# Patient Record
Sex: Female | Born: 1985 | Race: White | Hispanic: No | Marital: Single | Smoking: Never smoker
Health system: Southern US, Community
[De-identification: ages and names within clinical notes are randomized; demographics above are authoritative.]

## PROBLEM LIST (undated history)

## (undated) ENCOUNTER — Inpatient Hospital Stay (HOSPITAL_COMMUNITY): Payer: Self-pay

## (undated) DIAGNOSIS — B019 Varicella without complication: Secondary | ICD-10-CM

## (undated) DIAGNOSIS — K589 Irritable bowel syndrome without diarrhea: Secondary | ICD-10-CM

## (undated) DIAGNOSIS — B977 Papillomavirus as the cause of diseases classified elsewhere: Secondary | ICD-10-CM

## (undated) HISTORY — DX: Papillomavirus as the cause of diseases classified elsewhere: B97.7

## (undated) HISTORY — DX: Irritable bowel syndrome, unspecified: K58.9

## (undated) HISTORY — PX: CERVICAL CERCLAGE: SHX1329

## (undated) HISTORY — DX: Varicella without complication: B01.9

## (undated) HISTORY — PX: WISDOM TOOTH EXTRACTION: SHX21

---

## 2005-09-03 ENCOUNTER — Ambulatory Visit: Payer: Self-pay | Admitting: Family Medicine

## 2005-09-03 ENCOUNTER — Encounter: Payer: Self-pay | Admitting: Family Medicine

## 2005-09-03 ENCOUNTER — Other Ambulatory Visit: Admission: RE | Admit: 2005-09-03 | Discharge: 2005-09-03 | Payer: Self-pay | Admitting: Family Medicine

## 2006-01-24 ENCOUNTER — Ambulatory Visit: Payer: Self-pay | Admitting: Family Medicine

## 2006-03-26 ENCOUNTER — Ambulatory Visit: Payer: Self-pay | Admitting: Family Medicine

## 2006-08-01 ENCOUNTER — Ambulatory Visit: Payer: Self-pay | Admitting: Internal Medicine

## 2006-09-08 ENCOUNTER — Ambulatory Visit: Payer: Self-pay | Admitting: Family Medicine

## 2006-09-08 ENCOUNTER — Other Ambulatory Visit: Admission: RE | Admit: 2006-09-08 | Discharge: 2006-09-08 | Payer: Self-pay | Admitting: Family Medicine

## 2006-09-08 LAB — CONVERTED CEMR LAB: Pap Smear: NORMAL

## 2007-08-24 ENCOUNTER — Encounter: Payer: Self-pay | Admitting: Family Medicine

## 2007-08-24 DIAGNOSIS — K589 Irritable bowel syndrome without diarrhea: Secondary | ICD-10-CM

## 2007-09-15 ENCOUNTER — Other Ambulatory Visit: Admission: RE | Admit: 2007-09-15 | Discharge: 2007-09-15 | Payer: Self-pay | Admitting: Family Medicine

## 2007-09-15 ENCOUNTER — Encounter: Payer: Self-pay | Admitting: Family Medicine

## 2007-09-15 ENCOUNTER — Ambulatory Visit: Payer: Self-pay | Admitting: Family Medicine

## 2007-09-21 ENCOUNTER — Encounter (INDEPENDENT_AMBULATORY_CARE_PROVIDER_SITE_OTHER): Payer: Self-pay | Admitting: *Deleted

## 2008-07-12 ENCOUNTER — Encounter: Payer: Self-pay | Admitting: Family Medicine

## 2008-11-30 ENCOUNTER — Other Ambulatory Visit: Admission: RE | Admit: 2008-11-30 | Discharge: 2008-11-30 | Payer: Self-pay | Admitting: Family Medicine

## 2008-11-30 ENCOUNTER — Ambulatory Visit: Payer: Self-pay | Admitting: Family Medicine

## 2008-11-30 ENCOUNTER — Encounter: Payer: Self-pay | Admitting: Family Medicine

## 2008-12-05 ENCOUNTER — Encounter (INDEPENDENT_AMBULATORY_CARE_PROVIDER_SITE_OTHER): Payer: Self-pay | Admitting: *Deleted

## 2010-04-18 ENCOUNTER — Telehealth: Payer: Self-pay | Admitting: Family Medicine

## 2010-04-25 ENCOUNTER — Encounter: Payer: Self-pay | Admitting: Family Medicine

## 2010-04-26 ENCOUNTER — Other Ambulatory Visit: Admission: RE | Admit: 2010-04-26 | Discharge: 2010-04-26 | Payer: Self-pay | Admitting: Family Medicine

## 2010-04-26 ENCOUNTER — Ambulatory Visit: Payer: Self-pay | Admitting: Family Medicine

## 2010-04-26 LAB — HM PAP SMEAR

## 2010-05-01 ENCOUNTER — Encounter (INDEPENDENT_AMBULATORY_CARE_PROVIDER_SITE_OTHER): Payer: Self-pay | Admitting: *Deleted

## 2010-10-26 ENCOUNTER — Telehealth: Payer: Self-pay | Admitting: Family Medicine

## 2010-12-25 NOTE — Progress Notes (Signed)
Summary: rx for labs  Phone Note Call from Patient Call back at Home Phone [phone]   Caller: Patient Call For: Judith Part MD Summary of Call: Patient has CPX scheduled for 04-26-10. She is asking for a rx for blood work prior because mother works for [employer] and she woudl like to have it done there. Patient is requesting that it be mailed to home address.  Initial call taken by: Melody Comas,  Apr 18, 2010 9:24 AM  Follow-up for Phone Call        here is order for wellness labs to take to [facility] Follow-up by: Judith Part MD,  Apr 18, 2010 10:32 AM  Additional Follow-up for Phone Call Additional follow up Details #1::        Rx for lab put in mail for delivery.  Additional Follow-up by: Melody Comas,  Apr 18, 2010 11:21 AM

## 2010-12-25 NOTE — Letter (Signed)
Summary: Results Follow up Letter  [place] [facility]  [address] [place], [place] [address]   Phone: [phone]  Fax: [phone]    05/01/2010 MRN: [id number]    [facility] [address] [place], [place]  [address]    Dear Ms. HODGE,  The following are the results of your recent test(s):  Test         Result    Pap Smear:        Normal __X___  Not Normal _____ Comments: ______________________________________________________ Cholesterol: LDL(Bad cholesterol):         Your goal is less than:         HDL (Good cholesterol):       Your goal is more than: Comments:  ______________________________________________________ Mammogram:        Normal _____  Not Normal _____ Comments:  ___________________________________________________________________ Hemoccult:        Normal _____  Not normal _______ Comments:    _____________________________________________________________________ Other Tests:    We routinely do not discuss normal results over the telephone.  If you desire a copy of the results, or you have any questions about this information we can discuss them at your next office visit.   Sincerely,  Marne A. Milinda Antis, M.D.  MAT:lsf

## 2010-12-25 NOTE — Assessment & Plan Note (Signed)
Summary: cpx/alc   Vital Signs:  Patient profile:   25 year old female Height:      63 inches Weight:      144.75 pounds BMI:     25.73 Temp:     97.8 degrees F oral Pulse rate:   80 / minute Pulse rhythm:   regular BP sitting:   112 / 68  (left arm) Cuff size:   regular  Vitals Entered By: Lewanda Rife LPN (April 26, 1609 9:40 AM) CC: CPX with pap and breast exam LMP 04/18/10   History of Present Illness: here for health mt exam and pap / gyn exam   has been feeling good   wt is down 8 lb -- good bmi 25 very busy  also exercising regularly   bp great 112/68  IBS -- has been better this past year as a whole -- needs med infrequently  even with more stress has to avoid certain foods    kariva OC-- needs refil  menses - very predictable 3-4 d / mod cramps / not heavy at all  wants to stay on this oc  does not need std screen  pap 1/10 nl - no hx of abn paps   Td up to date    Allergies (verified): No Known Drug Allergies  Past History:  Past Medical History: Last updated: 11/30/2008 IBS  Family History: Last updated: 09/15/2007 GM breast cancer  Social History: Last updated: 11/30/2008 Marital Status: single Children: none Occupation: Consulting civil engineer, grad school works with student athletes non smoker  no alcohol   Risk Factors: Smoking Status: never (08/24/2007)  Review of Systems General:  Denies fatigue, loss of appetite, and malaise. Eyes:  Denies blurring, eye irritation, and eye pain. CV:  Denies chest pain or discomfort, near fainting, palpitations, and shortness of breath with exertion. Resp:  Denies cough and wheezing. GI:  Denies abdominal pain, bloody stools, change in bowel habits, and indigestion. MS:  Denies joint pain, joint redness, and joint swelling. Derm:  Denies itching, lesion(s), poor wound healing, and rash. Neuro:  Denies numbness, tingling, and weakness. Psych:  Denies anxiety and depression. Endo:  Denies cold intolerance,  excessive thirst, excessive urination, and polyuria. Heme:  Denies abnormal bruising and bleeding.  Physical Exam  General:  Well-developed,well-nourished,in no acute distress; alert,appropriate and cooperative throughout examination Head:  normocephalic, atraumatic, and no abnormalities observed.   Eyes:  vision grossly intact, pupils equal, pupils round, and pupils reactive to light.   Ears:  R ear normal and L ear normal.   Nose:  no nasal discharge.   Mouth:  pharynx pink and moist.   Neck:  supple with full rom and no masses or thyromegally, no JVD or carotid bruit  Chest Wall:  No deformities, masses, or tenderness noted. Breasts:  No mass, nodules, thickening, tenderness, bulging, retraction, inflamation, nipple discharge or skin changes noted.   Lungs:  Normal respiratory effort, chest expands symmetrically. Lungs are clear to auscultation, no crackles or wheezes. Heart:  Normal rate and regular rhythm. S1 and S2 normal without gallop, murmur, click, rub or other extra sounds. Abdomen:  Bowel sounds positive,abdomen soft and non-tender without masses, organomegaly or hernias noted. Genitalia:  Normal introitus for age, no external lesions, no vaginal discharge, mucosa pink and moist, no vaginal or cervical lesions, no vaginal atrophy, no friaility or hemorrhage, normal uterus size and position, no adnexal masses or tenderness Msk:  No deformity or scoliosis noted of thoracic or lumbar spine.  no  acute joint changes, full rom of all joints  Pulses:  R and L carotid,radial,femoral,dorsalis pedis and posterior tibial pulses are full and equal bilaterally Extremities:  No clubbing, cyanosis, edema, or deformity noted with normal full range of motion of all joints.   Neurologic:  sensation intact to light touch, gait normal, and DTRs symmetrical and normal.   Skin:  Intact without suspicious lesions or rashes Cervical Nodes:  No lymphadenopathy noted Axillary Nodes:  No palpable  lymphadenopathy Inguinal Nodes:  No significant adenopathy Psych:  normal affect, talkative and pleasant    Impression & Recommendations:  Problem # 1:  HEALTH MAINTENANCE EXAM (ICD-V70.0) Assessment Comment Only reviewed health habits including diet, exercise and skin cancer prevention reviewed health maintenance list and family history  pend labs from [facility]  Problem # 2:  GYNECOLOGICAL EXAMINATION, ROUTINE (ICD-V72.31) Assessment: Comment Only annual exam with pap  refil OC - no problems   Problem # 3:  IBS (ICD-564.1) Assessment: Improved overall improved with better diet   Complete Medication List: 1)  Kariva 0.15-0.02/0.01 Mg (21/5) Tabs (Desogestrel-ethinyl estradiol) .... One by mouth as directed once daily 2)  Align Caps (Misc intestinal flora regulat) .... One by mouth daily as needed 3)  Hyomax-sl 0.125 Mg Subl (Hyoscyamine sulfate) .... One sl as directed as needed  Patient Instructions: 1)  no change in your oral contraceptive  2)  pap done today - will update you with results  3)  I will update you with labs results when I get them Prescriptions: KARIVA 0.15-0.02/0.01 MG (21/5)  TABS (DESOGESTREL-ETHINYL ESTRADIOL) one by mouth as directed once daily  #90 x 3   Entered and Authorized by:   Judith Part MD   Signed by:   Judith Part MD on 04/26/2010   Method used:   Print then Give to Patient   RxID:   [id number]   Current Allergies (reviewed today): No known allergies

## 2010-12-25 NOTE — Progress Notes (Signed)
Summary: Erika Bryan   Phone Note Refill Request Call back at Home Phone [phone] Message from:  Patient on October 26, 2010 1:20 PM  Refills Requested: Medication #1:  KARIVA 0.15-0.02/0.01 MG (21/5)  TABS one by mouth as directed once daily [place], she just recently started a nw job. Patient doesn't knwo exactly which pharmacy she will be using yet so mom is asking for a written script to be mailed to the home address that we have in our system so that she can get it to her daughter.   Initial call taken by: Melody Comas,  October 26, 2010 2:11 PM  Follow-up for Phone Call        she will be due in june for PE-- so aim for finding new doc there before then printed in put in nurse in box for pickup  Follow-up by: Judith Part MD,  October 26, 2010 2:33 PM  Additional Follow-up for Phone Call Additional follow up Details #1::        Left message for patient to call back. Have not mailed rx yet would like to verify address first. Lewanda Rife LPN  October 26, 2010 4:22 PM     Additional Follow-up for Phone Call Additional follow up Details #2::    Spoke with pt's mother and pt is planning on taking a vacation day to see Dr Milinda Antis for her PE. Rx mailed to address in system and mother will forward to pt.Lewanda Rife LPN  October 29, 2010 8:44 AM   Prescriptions: KARIVA 0.15-0.02/0.01 MG (21/5)  TABS (DESOGESTREL-ETHINYL ESTRADIOL) one by mouth as directed once daily  #3 months x 1   Entered and Authorized by:   Judith Part MD   Signed by:   Judith Part MD on 10/26/2010   Method used:   Print then Give to Patient   RxID:   [id number]

## 2011-04-22 ENCOUNTER — Encounter: Payer: Self-pay | Admitting: Family Medicine

## 2011-05-01 ENCOUNTER — Encounter: Payer: Self-pay | Admitting: Family Medicine

## 2011-05-01 ENCOUNTER — Other Ambulatory Visit (HOSPITAL_COMMUNITY)
Admission: RE | Admit: 2011-05-01 | Discharge: 2011-05-01 | Disposition: A | Discharge status: Home / Self Care | Payer: BC Managed Care – PPO | Source: Ambulatory Visit | Attending: Family Medicine | Admitting: Family Medicine

## 2011-05-01 ENCOUNTER — Ambulatory Visit (INDEPENDENT_AMBULATORY_CARE_PROVIDER_SITE_OTHER): Payer: BC Managed Care – PPO | Admitting: Family Medicine

## 2011-05-01 DIAGNOSIS — Z01419 Encounter for gynecological examination (general) (routine) without abnormal findings: Secondary | ICD-10-CM | POA: Insufficient documentation

## 2011-05-01 DIAGNOSIS — Z124 Encounter for screening for malignant neoplasm of cervix: Secondary | ICD-10-CM | POA: Insufficient documentation

## 2011-05-01 DIAGNOSIS — Z0000 Encounter for general adult medical examination without abnormal findings: Secondary | ICD-10-CM | POA: Insufficient documentation

## 2011-05-01 DIAGNOSIS — R87810 Cervical high risk human papillomavirus (HPV) DNA test positive: Secondary | ICD-10-CM | POA: Insufficient documentation

## 2011-05-01 MED ORDER — DESOGESTREL-ETHINYL ESTRADIOL 0.15-0.02/0.01 MG (21/5) PO TABS: 1.0000 | ORAL_TABLET | Freq: Every day | ORAL | Status: DC

## 2011-05-01 NOTE — Assessment & Plan Note (Signed)
Annual exam with pap No problems or std exp Continue current OC Recommend otc nsaid for cramps prn If they worsen consider change the OC to lower estrogen or yaz

## 2011-05-01 NOTE — Assessment & Plan Note (Signed)
Reviewed health habits including diet and exercise and skin cancer prevention Also reviewed health mt list, fam hx and immunizations  Disc wt gain and exercise  Will get back to regular habits  Will check cholesterol again in next 1-2 years Rev last years labs with her

## 2011-05-01 NOTE — Patient Instructions (Signed)
Keep working on healthy exercise program  If you have worsening menstrual cramps - please let me know  Try aleve 2 pills with meal up to twice daily for cramps when you need it

## 2011-05-01 NOTE — Progress Notes (Signed)
Subjective:    Patient ID: Erika Bryan, female    DOB: 07-Oct-1986, 25 y.o.   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  Learning a lot  Done with grad school - not wanting doctorate yet   Has been feeling good  Takes fair care of herself  For exercise - less due to very long work hours  Now jogs or walks before work    Last pap nl 6/11 no problems  Std exp - none , does not want testing  On OC kariva -- likes that pill overall - cramps have increased a bit (not as bad as without a pill)  Bleeding is not too heavy - the same as usual  No abnormal paps    Td is up to date 2010  Wt is up 17 lb  Last year good labs with hdl in 60s and LDL 129-- working on low fat diet for that  No risk factors   Patient Active Problem List  Diagnoses  . IBS  . Routine general medical examination at a health care facility  . Gynecological examination   Past Medical History  Diagnosis Date  . IBS (irritable bowel syndrome)    No past surgical history on file. History  Substance Use Topics  . Smoking status: Never Smoker   . Smokeless tobacco: Not on file  . Alcohol Use:    No family history on file. No Known Allergies Current Outpatient Prescriptions on File Prior to Visit  Medication Sig Dispense Refill  . hyoscyamine (HYOMAX-SL) 0.125 MG SL tablet Place 0.125 mg under the tongue as directed. As needed.       . Probiotic Product (ALIGN PO) Take 1 tablet by mouth daily as needed.               Review of Systems Review of Systems  Constitutional: Negative for fever, appetite change, fatigue and unexpected weight change.  Eyes: Negative for pain and visual disturbance.  Respiratory: Negative for cough and shortness of breath.   Cardiovascular: Negative. For cp or sob or palp  Gastrointestinal: Negative for nausea, diarrhea and constipation.  Genitourinary: Negative for  urgency and frequency.  Skin: Negative for pallor.  gyn pos for menst cramps Neurological: Negative for weakness, light-headedness, numbness and headaches.  Hematological: Negative for adenopathy. Does not bruise/bleed easily.  Psychiatric/Behavioral: Negative for dysphoric mood. The patient is not nervous/anxious.          Objective:   Physical Exam  Constitutional: She appears well-developed and well-nourished. No distress.  HENT:  Head: Normocephalic and atraumatic.  Right Ear: External ear normal.  Left Ear: External ear normal.  Nose: Nose normal.  Mouth/Throat: Oropharynx is clear and moist.  Eyes: Conjunctivae and EOM are normal. Pupils are equal, round, and reactive to light.  Neck: Normal range of motion. No JVD present. No thyromegaly present.  Cardiovascular: Normal rate, regular rhythm, normal heart sounds and intact distal pulses.   Pulmonary/Chest: Effort normal and breath sounds normal. No respiratory distress. She has no wheezes. She exhibits no tenderness.  Abdominal: Bowel sounds are normal. She exhibits no distension and no mass. There is no tenderness.  Genitourinary: Vagina normal and uterus normal. No breast swelling, tenderness, discharge or bleeding. No vaginal discharge found.  Musculoskeletal: She exhibits no edema and no tenderness.  Lymphadenopathy:    She has no cervical  adenopathy.  Neurological: She is alert. She has normal reflexes. Coordination normal.  Skin: Skin is warm and dry. No rash noted. No erythema. No pallor.  Psychiatric: She has a normal mood and affect.          Assessment & Plan:

## 2011-05-03 ENCOUNTER — Encounter: Payer: Self-pay | Admitting: Family Medicine

## 2011-05-09 ENCOUNTER — Telehealth: Payer: Self-pay

## 2011-05-09 DIAGNOSIS — B977 Papillomavirus as the cause of diseases classified elsewhere: Secondary | ICD-10-CM

## 2011-05-09 NOTE — Telephone Encounter (Signed)
Appt Dr Patton Salles on 05/22/2011 at 1pm.  Patient notified of appt info.

## 2011-05-09 NOTE — Telephone Encounter (Signed)
Will do ref and route to [place] 

## 2011-05-09 NOTE — Telephone Encounter (Signed)
Patient notified as instructed by telephone. Pt said she would like to see GYN in [place]. Pt has never seen a GYN. Pt will wait to hear from pt care coordinator and can be reached at [phone].

## 2011-05-09 NOTE — Telephone Encounter (Signed)
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

## 2011-05-11 LAB — HM PAP SMEAR: HM Pap smear: POSITIVE

## 2011-08-08 ENCOUNTER — Other Ambulatory Visit: Payer: Self-pay | Admitting: Family Medicine

## 2012-04-02 ENCOUNTER — Telehealth: Payer: Self-pay | Admitting: Internal Medicine

## 2012-04-02 NOTE — Telephone Encounter (Signed)
Should be fine to do CPE.

## 2012-04-02 NOTE — Telephone Encounter (Signed)
Pt mom called to make Erika Bryan a new pt appointment.  She wanted to know if you could do a new patient and cpe at the same time.  i explain that on new pt you would go over family history medical history and any concerns, that you normally don't do cpx on 1st visit.  Mom explain that Erika Bryan lives in [place] and its hard for her to get off work and wanted to know if you would make exception. New patient appointment 06/11/12  @ 3

## 2012-04-03 NOTE — Telephone Encounter (Signed)
Spoke with mom to let her know dr walker was ok with doing cpe at same time as new patient

## 2012-04-22 ENCOUNTER — Other Ambulatory Visit: Payer: Self-pay | Admitting: *Deleted

## 2012-04-22 MED ORDER — DESOGESTREL-ETHINYL ESTRADIOL 0.15-0.02/0.01 MG (21/5) PO TABS: 1.0000 | ORAL_TABLET | Freq: Every day | ORAL | Status: DC

## 2012-05-11 ENCOUNTER — Other Ambulatory Visit: Payer: Self-pay | Admitting: Family Medicine

## 2012-05-11 NOTE — Telephone Encounter (Signed)
Denied--last date of Rx refill 05.29.13 #[id number]--too soon/SLS

## 2012-06-09 ENCOUNTER — Encounter: Payer: Self-pay | Admitting: Internal Medicine

## 2012-06-09 ENCOUNTER — Other Ambulatory Visit (HOSPITAL_COMMUNITY)
Admission: RE | Admit: 2012-06-09 | Discharge: 2012-06-09 | Disposition: A | Discharge status: Home / Self Care | Payer: BC Managed Care – PPO | Source: Ambulatory Visit | Attending: Internal Medicine | Admitting: Internal Medicine

## 2012-06-09 ENCOUNTER — Ambulatory Visit (INDEPENDENT_AMBULATORY_CARE_PROVIDER_SITE_OTHER): Payer: BC Managed Care – PPO | Admitting: Internal Medicine

## 2012-06-09 VITALS — BP 110/80 | HR 76 | Temp 98.8°F | Ht 62.5 in | Wt 152.0 lb

## 2012-06-09 DIAGNOSIS — R87810 Cervical high risk human papillomavirus (HPV) DNA test positive: Secondary | ICD-10-CM | POA: Insufficient documentation

## 2012-06-09 DIAGNOSIS — Z0000 Encounter for general adult medical examination without abnormal findings: Secondary | ICD-10-CM

## 2012-06-09 DIAGNOSIS — Z01419 Encounter for gynecological examination (general) (routine) without abnormal findings: Secondary | ICD-10-CM | POA: Insufficient documentation

## 2012-06-09 DIAGNOSIS — IMO0001 Reserved for inherently not codable concepts without codable children: Secondary | ICD-10-CM | POA: Insufficient documentation

## 2012-06-09 DIAGNOSIS — N760 Acute vaginitis: Secondary | ICD-10-CM | POA: Insufficient documentation

## 2012-06-09 DIAGNOSIS — Z309 Encounter for contraceptive management, unspecified: Secondary | ICD-10-CM

## 2012-06-09 LAB — COMPREHENSIVE METABOLIC PANEL
BUN: 9 mg/dL (ref 6–23)
CO2: 26 mEq/L (ref 19–32)
Creatinine, Ser: 0.8 mg/dL (ref 0.4–1.2)
GFR: 88.01 mL/min (ref >60.00)
Glucose, Bld: 89 mg/dL (ref 70–99)
Sodium: 140 mEq/L (ref 135–145)
Total Bilirubin: 1.5 mg/dL — ABNORMAL HIGH (ref 0.3–1.2)
Total Protein: 7.2 g/dL (ref 6.0–8.3)

## 2012-06-09 LAB — CBC WITH DIFFERENTIAL/PLATELET
Basophils Relative: 0.6 % (ref 0.0–3.0)
Eosinophils Relative: 0.8 % (ref 0.0–5.0)
Lymphocytes Relative: 36.4 % (ref 12.0–46.0)
MCV: 91.9 fl (ref 78.0–100.0)
Monocytes Absolute: 0.4 10*3/uL (ref 0.1–1.0)
Monocytes Relative: 6.7 % (ref 3.0–12.0)
Neutrophils Relative %: 55.5 % (ref 43.0–77.0)
RBC: 4.14 Mil/uL (ref 3.87–5.11)
WBC: 5.4 10*3/uL (ref 4.5–10.5)

## 2012-06-09 LAB — LIPID PANEL
Cholesterol: 211 mg/dL — ABNORMAL HIGH (ref 0–200)
Triglycerides: 105 mg/dL (ref 0.0–149.0)

## 2012-06-09 LAB — LDL CHOLESTEROL, DIRECT: Direct LDL: 128.9 mg/dL

## 2012-06-09 MED ORDER — DESOGESTREL-ETHINYL ESTRADIOL 0.15-0.02/0.01 MG (21/5) PO TABS: 1.0000 | ORAL_TABLET | Freq: Every day | ORAL | Status: DC

## 2012-06-09 NOTE — Assessment & Plan Note (Addendum)
General exam including breast and pelvic exam are normal today. Pap is pending. Will check basic lab work including CBC, CMP, and lipid profile. Encourage continued efforts at healthy diet and regular physical activity. Patient will followup in one year or sooner as needed.

## 2012-06-09 NOTE — Progress Notes (Signed)
Subjective:    Patient ID: Erika Bryan, female    DOB: 1986/09/06, 26 y.o.   MRN: [id number]  HPI 27 year old female presents to establish care and for general physical exam. She reports she is feeling well. She is active and works as an Runner, broadcasting/film/video at a local high school. She is physically active. She follows a relatively healthy diet. She is currently engaged to be married. 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 She underwent colposcopy he reports this was normal. She returns for yearly Pap smear today. She denies any concerns except for occasional cramping prior to her period for which she sometimes takes ibuprofen. She limits her use of ibuprofen however because of irritable bowel syndrome which seems to be made worse by this medication. She occasionally uses probiotic supplements to help with irritable bowel symptoms.  Outpatient Encounter Prescriptions as of 06/09/2012  Medication Sig Dispense Refill  . Ascorbic Acid (VITAMIN C) 1000 MG tablet Take 1,000 mg by mouth daily.      . cholecalciferol (VITAMIN D) 1000 UNITS tablet Take 1,000 Units by mouth daily.      Marland Kitchen desogestrel-ethinyl estradiol (VIORELE) 0.15-0.02/0.01 MG (21/5) tablet Take 1 tablet by mouth daily.  28 tablet  11  . hyoscyamine (HYOMAX-SL) 0.125 MG SL tablet Place 0.125 mg under the tongue as directed. As needed.       . Probiotic Product (ALIGN PO) Take 1 tablet by mouth daily as needed.        . vitamin A 8000 UNIT capsule Take 8,000 Units by mouth daily.        Review of Systems  Constitutional: Negative for fever, chills, appetite change, fatigue and unexpected weight change.  HENT: Negative for ear pain, congestion, sore throat, trouble swallowing, neck pain, voice change and sinus pressure.   Eyes: Negative for visual disturbance.  Respiratory: Negative for cough, shortness of breath, wheezing and stridor.   Cardiovascular: Negative for  chest pain, palpitations and leg swelling.  Gastrointestinal: Negative for nausea, vomiting, abdominal pain, diarrhea, constipation, blood in stool, abdominal distention and anal bleeding.  Genitourinary: Negative for dysuria and flank pain.  Musculoskeletal: Negative for myalgias, arthralgias and gait problem.  Skin: Negative for color change and rash.  Neurological: Negative for dizziness and headaches.  Hematological: Negative for adenopathy. Does not bruise/bleed easily.  Psychiatric/Behavioral: Negative for suicidal ideas, disturbed wake/sleep cycle and dysphoric mood. The patient is not nervous/anxious.    BP 110/80  Pulse 76  Temp 98.8 F (37.1 C) (Oral)  Ht 5' 2.5" (1.588 m)  Wt 152 lb (68.947 kg)  BMI 27.36 kg/m2  SpO2 98%  LMP 05/12/2012     Objective:   Physical Exam  Constitutional: She is oriented to person, place, and time. She appears well-developed and well-nourished. No distress.  HENT:  Head: Normocephalic and atraumatic.  Right Ear: External ear normal.  Left Ear: External ear normal.  Nose: Nose normal.  Mouth/Throat: Oropharynx is clear and moist. No oropharyngeal exudate.  Eyes: Conjunctivae are normal. Pupils are equal, round, and reactive to light. Right eye exhibits no discharge. Left eye exhibits no discharge. No scleral icterus.  Neck: Normal range of motion. Neck supple. No tracheal deviation present. No thyromegaly present.  Cardiovascular: Normal rate, regular rhythm, normal heart sounds and intact distal pulses.  Exam reveals no gallop and no friction rub.   No murmur heard. Pulmonary/Chest: Effort normal and breath sounds normal. No respiratory distress.  She has no wheezes. She has no rales. She exhibits no tenderness.  Abdominal: Soft. Bowel sounds are normal. She exhibits no distension and no mass. There is no tenderness. There is no rebound and no guarding.  Genitourinary: Rectum normal and uterus normal. No breast swelling, tenderness, discharge  or bleeding. Pelvic exam was performed with patient supine. There is no rash, tenderness or lesion on the right labia. There is no rash, tenderness or lesion on the left labia. Uterus is not enlarged and not tender. Cervix exhibits discharge. Cervix exhibits no motion tenderness and no friability. Right adnexum displays no mass, no tenderness and no fullness. Left adnexum displays no mass, no tenderness and no fullness. No erythema or tenderness around the vagina. Vaginal discharge found.  Musculoskeletal: Normal range of motion. She exhibits no edema and no tenderness.  Lymphadenopathy:    She has no cervical adenopathy.  Neurological: She is alert and oriented to person, place, and time. No cranial nerve deficit. She exhibits normal muscle tone. Coordination normal.  Skin: Skin is warm and dry. No rash noted. She is not diaphoretic. No erythema. No pallor.  Psychiatric: She has a normal mood and affect. Her behavior is normal. Judgment and thought content normal.          Assessment & Plan:

## 2012-06-11 ENCOUNTER — Ambulatory Visit: Payer: BC Managed Care – PPO | Admitting: Internal Medicine

## 2012-06-30 ENCOUNTER — Other Ambulatory Visit (INDEPENDENT_AMBULATORY_CARE_PROVIDER_SITE_OTHER): Payer: BC Managed Care – PPO | Admitting: *Deleted

## 2012-06-30 DIAGNOSIS — Z79899 Other long term (current) drug therapy: Secondary | ICD-10-CM

## 2012-06-30 LAB — HEPATIC FUNCTION PANEL
ALT: 16 U/L (ref 0–35)
AST: 28 U/L (ref 0–37)
Alkaline Phosphatase: 32 U/L — ABNORMAL LOW (ref 39–117)
Bilirubin, Direct: 0.1 mg/dL (ref 0.0–0.3)
Total Bilirubin: 1.2 mg/dL (ref 0.3–1.2)

## 2012-07-31 ENCOUNTER — Other Ambulatory Visit: Payer: Self-pay | Admitting: Internal Medicine

## 2012-09-02 ENCOUNTER — Encounter: Payer: BC Managed Care – PPO | Admitting: Family Medicine

## 2012-09-16 ENCOUNTER — Encounter: Payer: Self-pay | Admitting: Internal Medicine

## 2012-09-18 MED ORDER — DESOGESTREL-ETHINYL ESTRADIOL 0.15-0.02/0.01 MG (21/5) PO TABS: 1.0000 | ORAL_TABLET | Freq: Every day | ORAL | Status: DC

## 2012-09-25 ENCOUNTER — Other Ambulatory Visit: Payer: Self-pay | Admitting: Internal Medicine

## 2012-09-25 NOTE — Telephone Encounter (Signed)
Rx denied because it was sent electronically on 09/18/2012.

## 2013-01-09 ENCOUNTER — Other Ambulatory Visit: Payer: Self-pay

## 2013-06-15 ENCOUNTER — Other Ambulatory Visit (HOSPITAL_COMMUNITY)
Admission: RE | Admit: 2013-06-15 | Discharge: 2013-06-15 | Disposition: A | Discharge status: Home / Self Care | Payer: BC Managed Care – PPO | Source: Ambulatory Visit | Attending: Internal Medicine | Admitting: Internal Medicine

## 2013-06-15 ENCOUNTER — Ambulatory Visit (INDEPENDENT_AMBULATORY_CARE_PROVIDER_SITE_OTHER): Payer: BC Managed Care – PPO | Admitting: Internal Medicine

## 2013-06-15 ENCOUNTER — Encounter: Payer: Self-pay | Admitting: Internal Medicine

## 2013-06-15 VITALS — BP 130/86 | HR 77 | Temp 99.1°F | Ht 63.25 in | Wt 160.0 lb

## 2013-06-15 DIAGNOSIS — B977 Papillomavirus as the cause of diseases classified elsewhere: Secondary | ICD-10-CM

## 2013-06-15 DIAGNOSIS — IMO0002 Reserved for concepts with insufficient information to code with codable children: Secondary | ICD-10-CM

## 2013-06-15 DIAGNOSIS — Z0000 Encounter for general adult medical examination without abnormal findings: Secondary | ICD-10-CM

## 2013-06-15 DIAGNOSIS — R6889 Other general symptoms and signs: Secondary | ICD-10-CM

## 2013-06-15 DIAGNOSIS — R87810 Cervical high risk human papillomavirus (HPV) DNA test positive: Secondary | ICD-10-CM | POA: Insufficient documentation

## 2013-06-15 DIAGNOSIS — Z1151 Encounter for screening for human papillomavirus (HPV): Secondary | ICD-10-CM | POA: Insufficient documentation

## 2013-06-15 DIAGNOSIS — R7989 Other specified abnormal findings of blood chemistry: Secondary | ICD-10-CM

## 2013-06-15 DIAGNOSIS — Z01419 Encounter for gynecological examination (general) (routine) without abnormal findings: Secondary | ICD-10-CM | POA: Insufficient documentation

## 2013-06-15 LAB — COMPREHENSIVE METABOLIC PANEL
Alkaline Phosphatase: 31 U/L — ABNORMAL LOW (ref 39–117)
Creatinine, Ser: 0.9 mg/dL (ref 0.4–1.2)
Glucose, Bld: 87 mg/dL (ref 70–99)
Sodium: 138 mEq/L (ref 135–145)
Total Bilirubin: 1.3 mg/dL — ABNORMAL HIGH (ref 0.3–1.2)
Total Protein: 7.6 g/dL (ref 6.0–8.3)

## 2013-06-15 LAB — LIPID PANEL
Cholesterol: 208 mg/dL — ABNORMAL HIGH (ref 0–200)
HDL: 56.1 mg/dL (ref >39.00)
Total CHOL/HDL Ratio: 4
VLDL: 21.8 mg/dL (ref 0.0–40.0)

## 2013-06-15 LAB — LDL CHOLESTEROL, DIRECT: Direct LDL: 136.8 mg/dL

## 2013-06-15 LAB — CBC WITH DIFFERENTIAL/PLATELET
Eosinophils Relative: 0.8 % (ref 0.0–5.0)
Lymphocytes Relative: 34.5 % (ref 12.0–46.0)
Lymphs Abs: 2.5 10*3/uL (ref 0.7–4.0)
MCHC: 34.1 g/dL (ref 30.0–36.0)
MCV: 92 fl (ref 78.0–100.0)
Monocytes Relative: 4.2 % (ref 3.0–12.0)
Neutro Abs: 4.3 10*3/uL (ref 1.4–7.7)
Neutrophils Relative %: 59.8 % (ref 43.0–77.0)
Platelets: 213 10*3/uL (ref 150.0–400.0)
RBC: 4.31 Mil/uL (ref 3.87–5.11)
RDW: 12.1 % (ref 11.5–14.6)

## 2013-06-15 MED ORDER — DESOGESTREL-ETHINYL ESTRADIOL 0.15-0.02/0.01 MG (21/5) PO TABS: 1.0000 | ORAL_TABLET | Freq: Every day | ORAL | Status: DC

## 2013-06-15 NOTE — Assessment & Plan Note (Signed)
General medical exam normal today including breast exam. Pelvic exam is remarkable for friability of the cervix. Pap is pending. Encourage continued efforts at healthy diet and regular physical activity. Immunizations are up to date. Followup in one year or sooner as needed.

## 2013-06-15 NOTE — Progress Notes (Signed)
Subjective:    Patient ID: Erika Bryan, female    DOB: [birth year], 27 y.o.   MRN: [id number]  HPI 27 year old female presents for annual exam. She reports she is generally feeling well. No concerns today. She continues on oral contraceptive pill. She notes intermittent spotting on occasion between cycles. Her most recent menstrual cycle was described as heavy but generally cycles are light. She denies recent abdominal pain. She feels that she has learned to manage her IBS symptoms well by controlling food intake and using a probiotic.  Outpatient Encounter Prescriptions as of 06/15/2013  Medication Sig Dispense Refill  . desogestrel-ethinyl estradiol (VIORELE) 0.15-0.02/0.01 MG (21/5) tablet Take 1 tablet by mouth daily.  28 tablet  12  . hyoscyamine (HYOMAX-SL) 0.125 MG SL tablet Place 0.125 mg under the tongue as directed. As needed.       . MULTIPLE VITAMINS PO Take by mouth.      . Probiotic Product (ALIGN PO) Take 1 tablet by mouth daily as needed.        . Ascorbic Acid (VITAMIN C) 1000 MG tablet Take 1,000 mg by mouth daily.      . cholecalciferol (VITAMIN D) 1000 UNITS tablet Take 1,000 Units by mouth daily.      . vitamin A 8000 UNIT capsule Take 8,000 Units by mouth daily.       No facility-administered encounter medications on file as of 06/15/2013.   BP 130/86  Pulse 77  Temp(Src) 99.1 F (37.3 C) (Oral)  Ht 5' 3.25" (1.607 m)  Wt 160 lb (72.576 kg)  BMI 28.1 kg/m2  SpO2 98%  LMP 06/04/2013  Review of Systems  Constitutional: Negative for fever, chills, appetite change, fatigue and unexpected weight change.  HENT: Negative for ear pain, congestion, sore throat, trouble swallowing, neck pain, voice change and sinus pressure.   Eyes: Negative for visual disturbance.  Respiratory: Negative for cough, shortness of breath, wheezing and stridor.   Cardiovascular: Negative for chest pain, palpitations and leg swelling.  Gastrointestinal: Negative for nausea, vomiting,  abdominal pain, diarrhea, constipation, blood in stool, abdominal distention and anal bleeding.  Genitourinary: Negative for dysuria and flank pain.  Musculoskeletal: Negative for myalgias, arthralgias and gait problem.  Skin: Negative for color change and rash.  Neurological: Negative for dizziness and headaches.  Hematological: Negative for adenopathy. Does not bruise/bleed easily.  Psychiatric/Behavioral: Negative for suicidal ideas, sleep disturbance and dysphoric mood. The patient is not nervous/anxious.        Objective:   Physical Exam  Constitutional: She is oriented to person, place, and time. She appears well-developed and well-nourished. No distress.  HENT:  Head: Normocephalic and atraumatic.  Right Ear: External ear normal.  Left Ear: External ear normal.  Nose: Nose normal.  Mouth/Throat: Oropharynx is clear and moist. No oropharyngeal exudate.  Eyes: Conjunctivae are normal. Pupils are equal, round, and reactive to light. Right eye exhibits no discharge. Left eye exhibits no discharge. No scleral icterus.  Neck: Normal range of motion. Neck supple. No tracheal deviation present. No thyromegaly present.  Cardiovascular: Normal rate, regular rhythm, normal heart sounds and intact distal pulses.  Exam reveals no gallop and no friction rub.   No murmur heard. Pulmonary/Chest: Effort normal and breath sounds normal. No respiratory distress. She has no wheezes. She has no rales. She exhibits no tenderness.  Abdominal: Soft. Bowel sounds are normal. She exhibits no distension and no mass. There is no tenderness. There is no rebound and no guarding.  Genitourinary: Rectum  normal, vagina normal and uterus normal. No breast swelling, tenderness, discharge or bleeding. Pelvic exam was performed with patient supine. There is no rash, tenderness or lesion on the right labia. There is no rash, tenderness or lesion on the left labia. Uterus is not enlarged and not tender. Cervix exhibits  discharge (yellow-brown) and friability. Cervix exhibits no motion tenderness. Right adnexum displays no mass, no tenderness and no fullness. Left adnexum displays no mass, no tenderness and no fullness. No erythema or tenderness around the vagina. No vaginal discharge found.  Musculoskeletal: Normal range of motion. She exhibits no edema and no tenderness.  Lymphadenopathy:    She has no cervical adenopathy.  Neurological: She is alert and oriented to person, place, and time. No cranial nerve deficit. She exhibits normal muscle tone. Coordination normal.  Skin: Skin is warm and dry. No rash noted. She is not diaphoretic. No erythema. No pallor.  Psychiatric: She has a normal mood and affect. Her behavior is normal. Judgment and thought content normal.          Assessment & Plan:

## 2013-06-21 NOTE — Addendum Note (Signed)
Addended by: Ronna Polio A on: 06/21/2013 05:01 PM   Modules accepted: Orders

## 2013-07-27 LAB — HM PAP SMEAR: HM Pap smear: POSITIVE

## 2013-08-30 ENCOUNTER — Encounter: Payer: Self-pay | Admitting: Internal Medicine

## 2013-09-30 ENCOUNTER — Other Ambulatory Visit: Payer: Self-pay

## 2014-03-30 ENCOUNTER — Telehealth: Payer: Self-pay | Admitting: Internal Medicine

## 2014-03-30 NOTE — Telephone Encounter (Signed)
Left vm advising pt  

## 2014-03-30 NOTE — Telephone Encounter (Signed)
We can do her labs when she is here for her visit.

## 2014-03-30 NOTE — Telephone Encounter (Signed)
Pt called to confirm she will have labs at time of visit.

## 2014-03-30 NOTE — Telephone Encounter (Signed)
Pt has cpe scheduled 7/23 4:30 p.m.  Lives over an hour away.  Asking if labs are needed if she could have them done [facility] in [place], [place], Phone [phone].  Please call pt with advice.

## 2014-06-16 ENCOUNTER — Encounter: Payer: BC Managed Care – PPO | Admitting: Internal Medicine

## 2014-06-17 ENCOUNTER — Encounter: Payer: Self-pay | Admitting: *Deleted

## 2014-06-26 ENCOUNTER — Other Ambulatory Visit: Payer: Self-pay | Admitting: Internal Medicine

## 2014-06-27 NOTE — Telephone Encounter (Signed)
Appt 07/27/14

## 2014-07-27 ENCOUNTER — Ambulatory Visit (INDEPENDENT_AMBULATORY_CARE_PROVIDER_SITE_OTHER): Payer: PRIVATE HEALTH INSURANCE | Admitting: Internal Medicine

## 2014-07-27 ENCOUNTER — Encounter: Payer: Self-pay | Admitting: Internal Medicine

## 2014-07-27 ENCOUNTER — Other Ambulatory Visit (HOSPITAL_COMMUNITY)
Admission: RE | Admit: 2014-07-27 | Discharge: 2014-07-27 | Disposition: A | Discharge status: Home / Self Care | Payer: PRIVATE HEALTH INSURANCE | Source: Ambulatory Visit | Attending: Internal Medicine | Admitting: Internal Medicine

## 2014-07-27 VITALS — BP 118/80 | HR 73 | Temp 98.2°F | Ht 62.75 in | Wt 173.2 lb

## 2014-07-27 DIAGNOSIS — Z1151 Encounter for screening for human papillomavirus (HPV): Secondary | ICD-10-CM | POA: Diagnosis present

## 2014-07-27 DIAGNOSIS — E669 Obesity, unspecified: Secondary | ICD-10-CM

## 2014-07-27 DIAGNOSIS — B977 Papillomavirus as the cause of diseases classified elsewhere: Secondary | ICD-10-CM

## 2014-07-27 DIAGNOSIS — Z01419 Encounter for gynecological examination (general) (routine) without abnormal findings: Secondary | ICD-10-CM | POA: Insufficient documentation

## 2014-07-27 DIAGNOSIS — Z23 Encounter for immunization: Secondary | ICD-10-CM

## 2014-07-27 DIAGNOSIS — Z0000 Encounter for general adult medical examination without abnormal findings: Secondary | ICD-10-CM

## 2014-07-27 LAB — COMPREHENSIVE METABOLIC PANEL
ALBUMIN: 4 g/dL (ref 3.5–5.2)
ALT: 19 U/L (ref 0–35)
AST: 20 U/L (ref 0–37)
Alkaline Phosphatase: 37 U/L — ABNORMAL LOW (ref 39–117)
BUN: 12 mg/dL (ref 6–23)
CALCIUM: 9 mg/dL (ref 8.4–10.5)
CHLORIDE: 105 meq/L (ref 96–112)
CO2: 25 mEq/L (ref 19–32)
CREATININE: 0.8 mg/dL (ref 0.4–1.2)
GFR: 87.86 mL/min (ref >60.00)
GLUCOSE: 85 mg/dL (ref 70–99)
POTASSIUM: 3.9 meq/L (ref 3.5–5.1)
Sodium: 137 mEq/L (ref 135–145)
Total Bilirubin: 1.6 mg/dL — ABNORMAL HIGH (ref 0.2–1.2)
Total Protein: 6.9 g/dL (ref 6.0–8.3)

## 2014-07-27 LAB — CBC WITH DIFFERENTIAL/PLATELET
BASOS PCT: 0.6 % (ref 0.0–3.0)
Basophils Absolute: 0 10*3/uL (ref 0.0–0.1)
EOS ABS: 0.1 10*3/uL (ref 0.0–0.7)
EOS PCT: 1.1 % (ref 0.0–5.0)
HCT: 38.6 % (ref 36.0–46.0)
HEMOGLOBIN: 13.4 g/dL (ref 12.0–15.0)
LYMPHS PCT: 34.9 % (ref 12.0–46.0)
Lymphs Abs: 2.3 10*3/uL (ref 0.7–4.0)
MCHC: 34.6 g/dL (ref 30.0–36.0)
MCV: 89.9 fl (ref 78.0–100.0)
Monocytes Absolute: 0.3 10*3/uL (ref 0.1–1.0)
Monocytes Relative: 5 % (ref 3.0–12.0)
NEUTROS ABS: 3.9 10*3/uL (ref 1.4–7.7)
NEUTROS PCT: 58.4 % (ref 43.0–77.0)
Platelets: 214 10*3/uL (ref 150.0–400.0)
RBC: 4.29 Mil/uL (ref 3.87–5.11)
RDW: 12.5 % (ref 11.5–15.5)
WBC: 6.7 10*3/uL (ref 4.0–10.5)

## 2014-07-27 LAB — LIPID PANEL
CHOLESTEROL: 216 mg/dL — AB (ref 0–200)
HDL: 60.5 mg/dL (ref >39.00)
LDL Cholesterol: 133 mg/dL — ABNORMAL HIGH (ref 0–99)
NonHDL: 155.5
TRIGLYCERIDES: 115 mg/dL (ref 0.0–149.0)
Total CHOL/HDL Ratio: 4
VLDL: 23 mg/dL (ref 0.0–40.0)

## 2014-07-27 LAB — TSH: TSH: 1.53 u[IU]/mL (ref 0.35–4.50)

## 2014-07-27 LAB — VITAMIN D 25 HYDROXY (VIT D DEFICIENCY, FRACTURES): VITD: 39.17 ng/mL (ref 30.00–100.00)

## 2014-07-27 MED ORDER — DESOGESTREL-ETHINYL ESTRADIOL 0.15-0.02/0.01 MG (21/5) PO TABS
1.0000 | ORAL_TABLET | Freq: Every day | ORAL | Status: DC
Start: 2014-07-27 — End: 2016-09-27

## 2014-07-27 NOTE — Assessment & Plan Note (Signed)
General medical exam normal today including breast and pelvic exam. PAP pending. Flu vaccine today. Will check immunity to MMR, Varicella, Hep B. Encouraged healthy diet and exercise.

## 2014-07-27 NOTE — Progress Notes (Signed)
Subjective:    Patient ID: Erika Bryan, female    DOB: 1986-07-03, 28 y.o.   MRN: [id number]  HPI 28YO female presents for annual exam.  Seen by Irena Cords for colposcopy in 06/2013. Results were normal. Notes that menstrual cycles have had more cramping. Taking Aleve with some improvement.  Review of Systems  Constitutional: Negative for fever, chills, appetite change, fatigue and unexpected weight change.  Eyes: Negative for visual disturbance.  Respiratory: Negative for shortness of breath.   Cardiovascular: Negative for chest pain and leg swelling.  Gastrointestinal: Negative for nausea, vomiting, abdominal pain, diarrhea and constipation.  Genitourinary: Positive for menstrual problem.  Musculoskeletal: Negative for arthralgias and myalgias.  Skin: Negative for color change and rash.  Hematological: Negative for adenopathy. Does not bruise/bleed easily.  Psychiatric/Behavioral: Negative for sleep disturbance and dysphoric mood. The patient is not nervous/anxious.        Objective:    BP 118/80  Pulse 73  Temp(Src) 98.2 F (36.8 C) (Oral)  Ht 5' 2.75" (1.594 m)  Wt 173 lb 4 oz (78.586 kg)  BMI 30.93 kg/m2  SpO2 98%  LMP 07/08/2014 Physical Exam  Constitutional: She is oriented to person, place, and time. She appears well-developed and well-nourished. No distress.  HENT:  Head: Normocephalic and atraumatic.  Right Ear: External ear normal.  Left Ear: External ear normal.  Nose: Nose normal.  Mouth/Throat: Oropharynx is clear and moist. No oropharyngeal exudate.  Eyes: Conjunctivae are normal. Pupils are equal, round, and reactive to light. Right eye exhibits no discharge. Left eye exhibits no discharge. No scleral icterus.  Neck: Normal range of motion. Neck supple. No tracheal deviation present. No thyromegaly present.  Cardiovascular: Normal rate, regular rhythm, normal heart sounds and intact distal pulses.  Exam reveals no gallop and no friction rub.   No  murmur heard. Pulmonary/Chest: Effort normal and breath sounds normal. No respiratory distress. She has no wheezes. She has no rales. She exhibits no tenderness.  Abdominal: Soft. Bowel sounds are normal. She exhibits no distension and no mass. There is no tenderness. There is no rebound and no guarding.  Genitourinary: Rectum normal, vagina normal and uterus normal. No breast swelling, tenderness, discharge or bleeding. Pelvic exam was performed with patient supine. There is no rash, tenderness or lesion on the right labia. There is no rash, tenderness or lesion on the left labia. Uterus is not enlarged and not tender. Cervix exhibits no motion tenderness, no discharge and no friability. Right adnexum displays no mass, no tenderness and no fullness. Left adnexum displays no mass, no tenderness and no fullness. No erythema or tenderness around the vagina. No vaginal discharge found.  Musculoskeletal: Normal range of motion. She exhibits no edema and no tenderness.  Lymphadenopathy:    She has no cervical adenopathy.  Neurological: She is alert and oriented to person, place, and time. No cranial nerve deficit. She exhibits normal muscle tone. Coordination normal.  Skin: Skin is warm and dry. No rash noted. She is not diaphoretic. No erythema. No pallor.  Psychiatric: She has a normal mood and affect. Her behavior is normal. Judgment and thought content normal.          Assessment & Bryan:   Problem List Items Addressed This Visit     Unprioritized   HPV (human papilloma virus) infection   Obesity (BMI 30-39.9)      Wt Readings from Last 3 Encounters:  07/27/14 173 lb 4 oz (78.586 kg)  06/15/13 160 lb (  72.576 kg)  06/09/12 152 lb (68.947 kg)   Body mass index is 30.93 kg/(m^2).  Encouraged healthy diet and exercise.    Routine general medical examination at a health care facility - Primary     General medical exam normal today including breast and pelvic exam. PAP pending. Flu vaccine  today. Will check immunity to MMR, Varicella, Hep B. Encouraged healthy diet and exercise.    Relevant Orders      CBC with Differential      Comprehensive metabolic panel      Lipid panel      Vit D  25 hydroxy (rtn osteoporosis monitoring)      TSH      Measles/Mumps/Rubella Immunity      Varicella zoster antibody, IgG      Hepatitis B surface antibody       Return in about 1 year (around 07/28/2015) for Physical.

## 2014-07-27 NOTE — Patient Instructions (Signed)

## 2014-07-27 NOTE — Progress Notes (Signed)
Pre visit review using our clinic review tool, if applicable. No additional management support is needed unless otherwise documented below in the visit note. 

## 2014-07-27 NOTE — Assessment & Plan Note (Signed)
Wt Readings from Last 3 Encounters:  07/27/14 173 lb 4 oz (78.586 kg)  06/15/13 160 lb (72.576 kg)  06/09/12 152 lb (68.947 kg)   Body mass index is 30.93 kg/(m^2).  Encouraged healthy diet and exercise.

## 2014-07-27 NOTE — Addendum Note (Signed)
Addended by: Montine Circle D on: 07/27/2014 10:16 AM   Modules accepted: Orders

## 2014-07-27 NOTE — Addendum Note (Signed)
Addended by: Marchia Meiers on: 07/27/2014 10:28 AM   Modules accepted: Orders

## 2014-07-28 LAB — MEASLES/MUMPS/RUBELLA IMMUNITY
MUMPS IGG: 85.2 [AU]/ml — AB (ref <9.00)
RUBELLA: 6.06 {index} — AB (ref <0.90)
RUBEOLA IGG: 198 [AU]/ml — AB (ref <25.00)

## 2014-07-28 LAB — VARICELLA ZOSTER ANTIBODY, IGG: VARICELLA IGG: 1068 {index} — AB (ref <135.00)

## 2014-07-28 LAB — HEPATITIS B SURFACE ANTIBODY,QUALITATIVE: Hep B S Ab: POSITIVE — AB

## 2014-07-29 LAB — CYTOLOGY - PAP

## 2014-09-05 ENCOUNTER — Encounter: Payer: Self-pay | Admitting: Internal Medicine

## 2015-08-01 ENCOUNTER — Encounter: Payer: PRIVATE HEALTH INSURANCE | Admitting: Internal Medicine

## 2015-08-08 ENCOUNTER — Other Ambulatory Visit: Payer: Self-pay | Admitting: Obstetrics and Gynecology

## 2015-08-09 LAB — CYTOLOGY - PAP

## 2016-09-27 ENCOUNTER — Inpatient Hospital Stay (HOSPITAL_COMMUNITY): Payer: Managed Care, Other (non HMO)

## 2016-09-27 ENCOUNTER — Inpatient Hospital Stay (HOSPITAL_COMMUNITY)
Admission: AD | Admit: 2016-09-27 | Discharge: 2016-10-04 | DRG: 782 | Payer: Managed Care, Other (non HMO) | Source: Ambulatory Visit | Attending: Obstetrics and Gynecology | Admitting: Obstetrics and Gynecology

## 2016-09-27 ENCOUNTER — Encounter (HOSPITAL_COMMUNITY): Payer: Self-pay | Admitting: *Deleted

## 2016-09-27 DIAGNOSIS — O30042 Twin pregnancy, dichorionic/diamniotic, second trimester: Secondary | ICD-10-CM | POA: Diagnosis present

## 2016-09-27 DIAGNOSIS — O3432 Maternal care for cervical incompetence, second trimester: Secondary | ICD-10-CM | POA: Diagnosis present

## 2016-09-27 DIAGNOSIS — O3433 Maternal care for cervical incompetence, third trimester: Secondary | ICD-10-CM

## 2016-09-27 DIAGNOSIS — O26872 Cervical shortening, second trimester: Principal | ICD-10-CM | POA: Diagnosis present

## 2016-09-27 DIAGNOSIS — Z3A20 20 weeks gestation of pregnancy: Secondary | ICD-10-CM | POA: Diagnosis not present

## 2016-09-27 DIAGNOSIS — Z3A25 25 weeks gestation of pregnancy: Secondary | ICD-10-CM

## 2016-09-27 DIAGNOSIS — N883 Incompetence of cervix uteri: Secondary | ICD-10-CM

## 2016-09-27 LAB — CBC
HCT: 34.2 % — ABNORMAL LOW (ref 36.0–46.0)
Hemoglobin: 12 g/dL (ref 12.0–15.0)
MCH: 32.4 pg (ref 26.0–34.0)
MCHC: 35.1 g/dL (ref 30.0–36.0)
MCV: 92.4 fL (ref 78.0–100.0)
PLATELETS: 278 10*3/uL (ref 150–400)
RBC: 3.7 MIL/uL — AB (ref 3.87–5.11)
RDW: 13.5 % (ref 11.5–15.5)
WBC: 17.3 10*3/uL — ABNORMAL HIGH (ref 4.0–10.5)

## 2016-09-27 LAB — TYPE AND SCREEN
ABO/RH(D): A POS
ANTIBODY SCREEN: NEGATIVE

## 2016-09-27 LAB — ABO/RH: ABO/RH(D): A POS

## 2016-09-27 MED ORDER — PROGESTERONE MICRONIZED 200 MG PO CAPS
200.0000 mg | ORAL_CAPSULE | Freq: Every day | ORAL | Status: DC
  Administered 2016-09-27 – 2016-10-03 (×7): 200 mg via VAGINAL
  Filled 2016-09-27 (×7): qty 1

## 2016-09-27 MED ORDER — ZOLPIDEM TARTRATE 5 MG PO TABS
5.0000 mg | ORAL_TABLET | Freq: Every evening | ORAL | Status: DC | PRN
  Administered 2016-10-01: 5 mg via ORAL
  Filled 2016-09-27: qty 1

## 2016-09-27 MED ORDER — DOCUSATE SODIUM 100 MG PO CAPS
100.0000 mg | ORAL_CAPSULE | Freq: Every day | ORAL | Status: DC
  Administered 2016-09-28 – 2016-09-30 (×3): 100 mg via ORAL
  Filled 2016-09-27 (×3): qty 1

## 2016-09-27 MED ORDER — CALCIUM CARBONATE ANTACID 500 MG PO CHEW: 2.0000 | CHEWABLE_TABLET | ORAL | Status: DC | PRN

## 2016-09-27 MED ORDER — PRENATAL MULTIVITAMIN CH
1.0000 | ORAL_TABLET | Freq: Every day | ORAL | Status: DC
  Administered 2016-10-01 – 2016-10-03 (×3): 1 via ORAL
  Filled 2016-09-27 (×5): qty 1

## 2016-09-27 MED ORDER — ACETAMINOPHEN 325 MG PO TABS
650.0000 mg | ORAL_TABLET | ORAL | Status: DC | PRN
Start: 2016-09-27 — End: 2016-10-04
  Administered 2016-10-01: 650 mg via ORAL
  Filled 2016-09-27: qty 2

## 2016-09-27 NOTE — Consult Note (Signed)
US was performed in Maternal Fetal Care today. I have discussed the case with Dr. Langston MaskerMorris. Her cervical anatomy shows a 17mm length with funneling. Due to twins she is not a candidate for cerclage, but is possible a candidate for vaginal progesterone (200mg  suppository of micronized progesterone qHS or Crinone gel qHS). I recommend a 48 hour observation with a recheck of the cervical length prior to discharge. If the cervix is stable she can be managed as an outpatient on modified bedrest (off feet 20 of 24 hours). Weekly cervical evaluations in MFM would be indicated as well  Greater than 50% of my 15 minute visit was spent in face-to-face counseling of the patient and family

## 2016-09-27 NOTE — H&P (Signed)
Erika Bryan is a 30 y.o. female presenting for shortened cervical length.  The patient has di/di twin pregnancy conceived on Femara.  One week ago she was found to have dynamic cervix with normal length; today cervical length was 1.2-1.846mm.  She was admitted for further evaluation and management with MFM consult.  Per Dr. Ezzard StandingNewman of MFM, cerclage is not indicated secondary to twin gestation but he does recommend vaginal progesterone be started and strict bedrest.  The pregnancy is otherwise uncomplicated. The patient lives >1 hour from the hospital.   OB History    Gravida Para Term Preterm AB Living   2       1     SAB TAB Ectopic Multiple Live Births   1             Past Medical History:  Diagnosis Date  . Chicken pox   . HPV (human papilloma virus) infection   . IBS (irritable bowel syndrome)    s/p evaluation including colonoscopy which was normal   Past Surgical History:  Procedure Laterality Date  . WISDOM TOOTH EXTRACTION     Family History: family history includes Cancer in her maternal grandmother and paternal grandmother; Hyperlipidemia in her mother. Social History:  reports that she has never smoked. She has never used smokeless tobacco. She reports that she drinks alcohol. She reports that she does not use drugs.     Maternal Diabetes: No Genetic Screening: Normal Maternal Ultrasounds/Referrals: Abnormal:  Findings:   Other: shortened cervical length Fetal Ultrasounds or other Referrals:  Referred to Materal Fetal Medicine  Maternal Substance Abuse:  No Significant Maternal Medications:  None Significant Maternal Lab Results:  None Other Comments:  None  Review of Systems  Constitutional: Negative for fever.  Gastrointestinal: Negative for abdominal pain, nausea and vomiting.  Neurological: Negative for headaches.   Maternal Medical History:  Reason for admission: Nausea.  Fetal activity: Perceived fetal activity is normal.   Last perceived fetal movement  was within the past hour.    Prenatal Complications - Diabetes: none.      Blood pressure 120/90, pulse 90, temperature 98.4 F (36.9 C), temperature source Oral, resp. rate 16, height 5\' 3"  (1.6 m), weight 165 lb (74.8 kg), last menstrual period 05/08/2016. Maternal Exam:  Abdomen: Patient reports no abdominal tenderness. Fundal height is c/w dates.       Physical Exam  Constitutional: She is oriented to person, place, and time. She appears well-developed and well-nourished.  GI: Soft. There is no rebound and no guarding.  Neurological: She is alert and oriented to person, place, and time.  Skin: Skin is warm and dry.  Psychiatric: She has a normal mood and affect. Her behavior is normal.    Prenatal labs: ABO, Rh: --/--/A POS (11/03 1320) Antibody: NEG (11/03 1320) Rubella:   RPR:    HBsAg:    HIV:    GBS:     Assessment/Plan: 30yo G2P0 at 325w2d with shortened cervical length, di/di twins -Vaginal P4 -FHT q shift -SCDs -Rpt u/s in 48 h -bedrest  Tihanna Goodson 09/27/2016, 3:40 PM

## 2016-09-28 NOTE — Progress Notes (Signed)
No acute c/o.  Active FM.  No change in pelvic pressure or discharge.  VSS. AF. +FHT x 2  Gen: A&O x 3 Abd: soft, NT, gravid Ext: no c/c/e  30yo G2P0 at 2656w3d with di/di twins and shortened CL -Continue vaginal progesterone -Repeat cervical length tomorrow to assess for d/c plan.  Pt lives >1 hour away from the hospital -Bed rest -SCDs  Mitchel HonourMegan Stan Cantave, DO

## 2016-09-28 NOTE — Progress Notes (Signed)
In to check on pt and she reports positional low back pain and uterine cramping.  She noticed it about 2-3 hours ago and after voiding it has resolved.  Will monitor and consider Indocin course prn.   Mitchel HonourMegan Tahirih Lair, DO

## 2016-09-29 ENCOUNTER — Inpatient Hospital Stay (HOSPITAL_COMMUNITY): Payer: Managed Care, Other (non HMO)

## 2016-09-29 LAB — CBC WITH DIFFERENTIAL/PLATELET
Basophils Absolute: 0 10*3/uL (ref 0.0–0.1)
Basophils Relative: 0 %
Eosinophils Absolute: 0.1 10*3/uL (ref 0.0–0.7)
Eosinophils Relative: 1 %
HEMATOCRIT: 35.5 % — AB (ref 36.0–46.0)
HEMOGLOBIN: 12.3 g/dL (ref 12.0–15.0)
LYMPHS ABS: 2.2 10*3/uL (ref 0.7–4.0)
LYMPHS PCT: 16 %
MCH: 32 pg (ref 26.0–34.0)
MCHC: 34.6 g/dL (ref 30.0–36.0)
MCV: 92.4 fL (ref 78.0–100.0)
MONOS PCT: 5 %
Monocytes Absolute: 0.7 10*3/uL (ref 0.1–1.0)
NEUTROS ABS: 10.9 10*3/uL — AB (ref 1.7–7.7)
NEUTROS PCT: 78 %
Platelets: 257 10*3/uL (ref 150–400)
RBC: 3.84 MIL/uL — ABNORMAL LOW (ref 3.87–5.11)
RDW: 13.6 % (ref 11.5–15.5)
WBC: 13.9 10*3/uL — ABNORMAL HIGH (ref 4.0–10.5)

## 2016-09-29 NOTE — Progress Notes (Signed)
No acute c/o.  Active FM.  No change in pelvic pressure or discharge.  No continued CTX after eval last pm.  VSS. AF. +FHT x 2  Prelim read on today's u/s: CL funneling to 1.1 cm  Gen: A&O x 3 Abd: soft, NT, gravid Ext: no c/c/e  30yo G2P0 at 3568w4d with di/di twins and shortened CL -Continue vaginal progesterone -Await final read/recs on MFM u/s. Possible d/c in AM if stable. -Bed rest -SCDs  Erika HonourMegan Octavius Shin, DO

## 2016-09-29 NOTE — Progress Notes (Signed)
   09/28/16 2115  Uterine Activity  Mode Toco  Contraction Frequency (min) no contractions  pt on continuous toco with no order. Asked pt if she's contracting, she stated no but wants to keep toco on because it make her feel better to know she's not contracting on the monitor

## 2016-09-30 ENCOUNTER — Inpatient Hospital Stay (HOSPITAL_COMMUNITY): Payer: Managed Care, Other (non HMO)

## 2016-09-30 MED ORDER — FAMOTIDINE 20 MG PO TABS
20.0000 mg | ORAL_TABLET | Freq: Every day | ORAL | Status: DC
  Administered 2016-09-30 – 2016-10-01 (×2): 20 mg via ORAL
  Filled 2016-09-30 (×3): qty 1

## 2016-09-30 MED ORDER — NIFEDIPINE 10 MG PO CAPS
10.0000 mg | ORAL_CAPSULE | Freq: Four times a day (QID) | ORAL | Status: DC
  Administered 2016-09-30 – 2016-10-04 (×17): 10 mg via ORAL
  Filled 2016-09-30 (×16): qty 1

## 2016-09-30 NOTE — Progress Notes (Signed)
Not feeling UCs  VSS Afeb  D/W patient possible uterine activity reflected in dynamic cervix Will begin nifedipine and continue vaginal progesterone  U/S in am to check cervix

## 2016-09-30 NOTE — Progress Notes (Signed)
20 5/7 weeks  No leaking/bleeding Patient inquiring about pessary  VSS Afeb Uterus soft, NT UCs irregular, mild  U/S yesterday-final report pending  A/P: Di/Di Twins with cervical funneling         Vaginal progesterone         BR/SCD         I D/W Dr Ezzard StandingNewman this am-he will see patient today to discuss pessary

## 2016-09-30 NOTE — Consult Note (Signed)
Spoke to the patient and her mother at Dr. Huel Coventryomblin's request. She was curious as to whether or not pessary might be helpful in her situation. I let her know that the data on pessary in short cervix is unclear, with mixed results, especially in twins. Current SMFM recommendations do not include pessary use for the prevention of PTD outside of a clinical research study. I reviewed her US from yesterday. There is clearly uterine activity present, with multiple instances of spontaneous shortening and lengthening of the cervix. I would recommend initiation of nifedipine 10mg  q6h for uterine activity suppresion.  Greater than 50% of the face-to-face time of this 10 minute incounter was spent in counseling the patient and her family

## 2016-10-01 ENCOUNTER — Inpatient Hospital Stay (HOSPITAL_COMMUNITY): Payer: Managed Care, Other (non HMO)

## 2016-10-01 NOTE — Progress Notes (Signed)
Patient ID: Erika Bryan, female   DOB: 05/01/1986, 30 y.o.   MRN: [id number]  Has US sched today for cx length eval  Pt wants pessary trial  Cont vag P4 and PO nifedipine

## 2016-10-01 NOTE — Progress Notes (Signed)
#  3 cup pessary placed around ecto cervix per protocol>>>cx closed

## 2016-10-02 NOTE — Progress Notes (Signed)
Patient had pessary placed yesterday. She is tolerating it well.  BP 120/65 (BP Location: Left Arm)   Pulse (!) 106 Comment: nurse notified Zelvia  Temp 98.5 F (36.9 C) (Oral)   Resp 18   Ht 5\' 3"  (1.6 m)   Wt 74.8 kg (165 lb)   LMP 05/08/2016 (LMP Unknown)   SpO2 97%   BMI 29.23 kg/m  Abdomen is soft and non tender  Review of ultrasound  Ballooning membranes down to external os and external os measures maybe 1 mm  IMPRESSION: TWIN IUP at 21 weeks Cervical incompetence Short cervix  PLAN: Continue pessary  Continue nifedipine and progesterone  Discussed with patient she needs to stay in house because of high probability of preterm possible previable delivery and she lives over 1 hour from the hospital

## 2016-10-03 ENCOUNTER — Other Ambulatory Visit (HOSPITAL_COMMUNITY): Payer: Managed Care, Other (non HMO)

## 2016-10-03 MED ORDER — DOCUSATE SODIUM 100 MG PO CAPS
100.0000 mg | ORAL_CAPSULE | Freq: Every day | ORAL | Status: DC
  Administered 2016-10-03: 100 mg via ORAL
  Filled 2016-10-03 (×2): qty 1

## 2016-10-03 NOTE — Progress Notes (Signed)
TOCO ordered an hour daily. MD aware strip will not be an epic at this time/

## 2016-10-03 NOTE — Progress Notes (Signed)
Patient ID: Erika Bryan, female   DOB: 06/16/1986, 30 y.o.   MRN: [id number] Pt reports GFM Cant feel ctxs  VSSAF FHR + x 2  Abd Gravid , nt Neg homans  Twins at 21 weeks PTL and cervical incompetence  Continue pessary  Continue nifedipine and progesterone  Discussed with patient she needs to stay in house because of high probability of preterm possible previable delivery and she lives over 1 hour from the hospital US tomorrow to assess cx.  Toco 1 h q day

## 2016-10-04 ENCOUNTER — Inpatient Hospital Stay (HOSPITAL_COMMUNITY): Payer: Managed Care, Other (non HMO)

## 2016-10-04 MED ORDER — BUPIVACAINE HCL (PF) 0.25 % IJ SOLN
INTRAMUSCULAR | Status: AC
  Filled 2016-10-04: qty 30

## 2016-10-04 NOTE — Progress Notes (Signed)
   10/04/16 0950  Section 1: Physician Certification  Patient Condition Patient stabilized  Reason for Transfer cervical incompetence  Benefits of Transfer inpatient care by maternal fetal medicine  Accepting Physician Dr Laray AngerNiche, MFM  S[facility] Physician [facility]  Physician Reassessment Patient examined and risks and benefits explained  Section 2: Nurse Certification  Accepting hospital or facility Available service confirmed  Accepting Facility [facility]

## 2016-10-04 NOTE — Progress Notes (Signed)
Report given to [facility], L&D triage RN at [facility]. [phone].

## 2016-10-04 NOTE — Progress Notes (Signed)
Pt resting.  Denies ctx, pain, vb and lof.  AF, VSS + FHT x 2 Abd - gravid, NT Ext - NT, no edema  US:  Progressive shortening of cervix - no measurable length with dilation (approx 2.5-3cm)  A/P:  Twin gestation @ 21+2wks Cervical incompetence Discussed case with Dr Laray [facility]Niche @ [facility] re: transfer for emergent rescue cerclage He accepts pt in transfer.  Pt and family agree with plan of care

## 2016-10-07 ENCOUNTER — Other Ambulatory Visit (HOSPITAL_COMMUNITY): Payer: Self-pay | Admitting: Maternal and Fetal Medicine

## 2016-10-07 DIAGNOSIS — O3432 Maternal care for cervical incompetence, second trimester: Secondary | ICD-10-CM

## 2016-10-11 ENCOUNTER — Encounter (HOSPITAL_COMMUNITY): Payer: Self-pay

## 2016-10-11 ENCOUNTER — Ambulatory Visit (HOSPITAL_COMMUNITY)
Admission: RE | Admit: 2016-10-11 | Discharge: 2016-10-11 | Disposition: A | Discharge status: Home / Self Care | Payer: Managed Care, Other (non HMO) | Source: Ambulatory Visit | Attending: Obstetrics and Gynecology | Admitting: Obstetrics and Gynecology

## 2016-10-11 DIAGNOSIS — O30042 Twin pregnancy, dichorionic/diamniotic, second trimester: Secondary | ICD-10-CM | POA: Insufficient documentation

## 2016-10-11 DIAGNOSIS — O26852 Spotting complicating pregnancy, second trimester: Secondary | ICD-10-CM | POA: Diagnosis not present

## 2016-10-11 DIAGNOSIS — O26872 Cervical shortening, second trimester: Secondary | ICD-10-CM | POA: Insufficient documentation

## 2016-10-11 DIAGNOSIS — O3432 Maternal care for cervical incompetence, second trimester: Secondary | ICD-10-CM

## 2016-10-11 DIAGNOSIS — Z3A22 22 weeks gestation of pregnancy: Secondary | ICD-10-CM

## 2016-10-11 NOTE — ED Notes (Signed)
Pt states she has had some light pink/yellowish discharge when she first wakes up in the morning.  Pt states she's been having some issues with constipation but has started colace.  Successful BM today.  Pt states she has also had some pressure which stopped after the BM.  Increased in stringy yellowish/clear discharge.  Pt is currently using progesterone at bedtime.

## 2016-10-14 ENCOUNTER — Other Ambulatory Visit (HOSPITAL_COMMUNITY): Payer: Self-pay | Admitting: *Deleted

## 2016-10-14 ENCOUNTER — Encounter (HOSPITAL_COMMUNITY): Payer: Self-pay | Admitting: *Deleted

## 2016-10-14 ENCOUNTER — Inpatient Hospital Stay (HOSPITAL_COMMUNITY)
Admission: AD | Admit: 2016-10-14 | Discharge: 2016-10-15 | DRG: 782 | Payer: Managed Care, Other (non HMO) | Source: Ambulatory Visit | Attending: Obstetrics and Gynecology | Admitting: Obstetrics and Gynecology

## 2016-10-14 ENCOUNTER — Inpatient Hospital Stay (HOSPITAL_COMMUNITY): Payer: Managed Care, Other (non HMO)

## 2016-10-14 DIAGNOSIS — O09219 Supervision of pregnancy with history of pre-term labor, unspecified trimester: Secondary | ICD-10-CM

## 2016-10-14 DIAGNOSIS — O26852 Spotting complicating pregnancy, second trimester: Secondary | ICD-10-CM | POA: Diagnosis present

## 2016-10-14 DIAGNOSIS — Z3A22 22 weeks gestation of pregnancy: Secondary | ICD-10-CM

## 2016-10-14 DIAGNOSIS — O30042 Twin pregnancy, dichorionic/diamniotic, second trimester: Secondary | ICD-10-CM | POA: Diagnosis present

## 2016-10-14 DIAGNOSIS — O3432 Maternal care for cervical incompetence, second trimester: Principal | ICD-10-CM | POA: Diagnosis present

## 2016-10-14 DIAGNOSIS — O30049 Twin pregnancy, dichorionic/diamniotic, unspecified trimester: Secondary | ICD-10-CM

## 2016-10-14 DIAGNOSIS — N883 Incompetence of cervix uteri: Secondary | ICD-10-CM

## 2016-10-14 LAB — URINALYSIS, ROUTINE W REFLEX MICROSCOPIC
BILIRUBIN URINE: NEGATIVE
Glucose, UA: NEGATIVE mg/dL
KETONES UR: NEGATIVE mg/dL
NITRITE: NEGATIVE
PH: 6.5 (ref 5.0–8.0)
Protein, ur: NEGATIVE mg/dL

## 2016-10-14 LAB — URINE MICROSCOPIC-ADD ON: Bacteria, UA: NONE SEEN

## 2016-10-14 LAB — CBC
HEMATOCRIT: 36.6 % (ref 36.0–46.0)
Hemoglobin: 12.9 g/dL (ref 12.0–15.0)
MCH: 32.2 pg (ref 26.0–34.0)
MCHC: 35.2 g/dL (ref 30.0–36.0)
MCV: 91.3 fL (ref 78.0–100.0)
PLATELETS: 276 10*3/uL (ref 150–400)
RBC: 4.01 MIL/uL (ref 3.87–5.11)
RDW: 13 % (ref 11.5–15.5)
WBC: 18.9 10*3/uL — ABNORMAL HIGH (ref 4.0–10.5)

## 2016-10-14 LAB — TYPE AND SCREEN
ABO/RH(D): A POS
Antibody Screen: NEGATIVE

## 2016-10-14 MED ORDER — LACTATED RINGERS IV BOLUS (SEPSIS)
1000.0000 mL | Freq: Once | INTRAVENOUS | Status: AC
  Administered 2016-10-14: 1000 mL via INTRAVENOUS

## 2016-10-14 MED ORDER — MAGNESIUM SULFATE BOLUS VIA INFUSION
4.0000 g | Freq: Once | INTRAVENOUS | Status: AC
  Administered 2016-10-14: 4 g via INTRAVENOUS
  Filled 2016-10-14: qty 500

## 2016-10-14 MED ORDER — CALCIUM CARBONATE ANTACID 500 MG PO CHEW: 2.0000 | CHEWABLE_TABLET | ORAL | Status: DC | PRN

## 2016-10-14 MED ORDER — PRENATAL MULTIVITAMIN CH
1.0000 | ORAL_TABLET | Freq: Every day | ORAL | Status: DC
  Administered 2016-10-15: 1 via ORAL
  Filled 2016-10-14: qty 1

## 2016-10-14 MED ORDER — ACETAMINOPHEN 325 MG PO TABS
650.0000 mg | ORAL_TABLET | ORAL | Status: DC | PRN
Start: 2016-10-14 — End: 2016-10-15
  Filled 2016-10-14: qty 2

## 2016-10-14 MED ORDER — DOCUSATE SODIUM 100 MG PO CAPS: 100.0000 mg | ORAL_CAPSULE | Freq: Every day | ORAL | Status: DC

## 2016-10-14 MED ORDER — MAGNESIUM SULFATE 50 % IJ SOLN
2.0000 g/h | INTRAMUSCULAR | Status: DC
  Administered 2016-10-14 – 2016-10-15 (×2): 2 g/h via INTRAVENOUS
  Filled 2016-10-14 (×2): qty 80

## 2016-10-14 MED ORDER — PRENATAL MULTIVITAMIN CH: 1.0000 | ORAL_TABLET | Freq: Every day | ORAL | Status: DC

## 2016-10-14 MED ORDER — ZOLPIDEM TARTRATE 5 MG PO TABS: 5.0000 mg | ORAL_TABLET | Freq: Every evening | ORAL | Status: DC | PRN

## 2016-10-14 MED ORDER — BETAMETHASONE SOD PHOS & ACET 6 (3-3) MG/ML IJ SUSP
12.0000 mg | INTRAMUSCULAR | Status: DC
  Administered 2016-10-14: 12 mg via INTRAMUSCULAR
  Filled 2016-10-14 (×2): qty 2

## 2016-10-14 MED ORDER — PROGESTERONE MICRONIZED 200 MG PO CAPS
200.0000 mg | ORAL_CAPSULE | Freq: Every day | ORAL | Status: DC
  Administered 2016-10-14: 200 mg via VAGINAL
  Filled 2016-10-14: qty 1

## 2016-10-14 MED ORDER — DOCUSATE SODIUM 100 MG PO CAPS
100.0000 mg | ORAL_CAPSULE | Freq: Every day | ORAL | Status: DC
  Administered 2016-10-15: 100 mg via ORAL
  Filled 2016-10-14 (×2): qty 1

## 2016-10-14 MED ORDER — LACTATED RINGERS IV SOLN
INTRAVENOUS | Status: DC
  Administered 2016-10-14 (×2): via INTRAVENOUS

## 2016-10-14 NOTE — MAU Note (Signed)
Cerclage placed 10/11.  Started spotting, noted stringy red  When wiped. First noted yesterday morning after BM.  Continued through the day. Noted again this morning after Bm, then stopped, now has restarted.  Has been having ? [name] today.  The most was 4/hr.  Usually had 2/hr today, 3 on the way here. Every thing was stable at check on Friday.

## 2016-10-14 NOTE — H&P (Signed)
Golden PopLeslie R Bathe is a 30 y.o. female presenting for spotting and irreg ctx, hospt here 11/07 with shotened cervix, not felt to be candidate for cerclage>>>was treated with BR + vag P4 and nifedipine, later cup pessary was placed>>  2 days later, US consist with poss cx dilitation and [facility]>>then D/C home to cont nifedipine + vag P4  Called office today with some irreg ctx + spotting, adm for PTL eval. OB History    Gravida Para Term Preterm AB Living   2       1     SAB TAB Ectopic Multiple Live Births   1             Past Medical History:  Diagnosis Date  . Chicken pox   . HPV (human papilloma virus) infection   . IBS (irritable bowel syndrome)    s/p evaluation including colonoscopy which was normal   Past Surgical History:  Procedure Laterality Date  . CERVICAL CERCLAGE    . WISDOM TOOTH EXTRACTION     Family History: family history includes Cancer in her maternal grandmother and paternal grandmother; Hyperlipidemia in her mother. Social History:  reports that she has never smoked. She has never used smokeless tobacco. She reports that she drinks alcohol. She reports that she does not use drugs.     Maternal Diabetes: No Genetic Screening: Normal Maternal Ultrasounds/Referrals: Normal Fetal Ultrasounds or other Referrals:  None Maternal Substance Abuse:  No Significant Maternal Medications:  None Significant Maternal Lab Results:  None Other Comments:  None  ROS Maternal Medical History:  Reason for admission: Contractions and vaginal bleeding.   Contractions: Onset was 3-5 hours ago.   Frequency: irregular.   Perceived severity is mild.        Blood pressure 130/72, pulse 89, temperature 98.2 F (36.8 C), temperature source Oral, resp. rate 18, height 5\' 3"  (1.6 m), weight 183 lb (83 kg), last menstrual period 05/08/2016. Exam Physical Exam  Constitutional: She is oriented to person, place, and time. She appears  well-developed and well-nourished.  HENT:  Head: Normocephalic and atraumatic.  Neck: Normal range of motion. Neck supple.  Cardiovascular: Normal rate and regular rhythm.   Respiratory: Effort normal and breath sounds normal.  GI:  Fundus soft, nontender  Genitourinary:  Genitourinary Comments: Spec exam>>>ceclage intact, min spotting  Musculoskeletal: Normal range of motion.  Neurological: She is alert and oriented to person, place, and time.    Prenatal labs: ABO, Rh: --/--/A POS (11/20 2143) Antibody: NEG (11/20 2143) Rubella:   RPR:    HBsAg:    HIV:    GBS:     Assessment/Plan: 4275w5d TWINS PTL with rescue cerclage, cx length now 1.0cm  ADMIT for BR +MgSO4, Neo + MFM consult in am, start BMZ series   Ludger Bones M 10/14/2016, 10:59 PM

## 2016-10-14 NOTE — MAU Provider Note (Signed)
History     CSN: [id number]  Arrival date and time: 10/14/16 [id number]   First Provider Initiated Contact with Patient 10/14/16 1814      Chief Complaint  Patient presents with  . Vaginal Bleeding  . Abdominal Cramping   HPI Erika Bryan is a 30 y.o. G2P0010 at 6542w5d with di/di twins who presents vaginal bleeding & abdominal pain. Pt had cerclage placed 10/11 & most recent ultrasound 3 days ago showed a cervical length of 1 cm.  Reports bright red vaginal spotting yesterday and this morning after BMs. Has not been heavier than spotting on toilet paper and went away by the end of the day. Also reports abdominal pain that feels like bad cramps. Normally feels them a few times per day but today has felt them more frequently. Has felt 5 contractions in the last hour. Denies LOF, n/v/d, constipation, or recent intercourse.   OB History    Gravida Para Term Preterm AB Living   2       1     SAB TAB Ectopic Multiple Live Births   1              Past Medical History:  Diagnosis Date  . Chicken pox   . HPV (human papilloma virus) infection   . IBS (irritable bowel syndrome)    s/p evaluation including colonoscopy which was normal    Past Surgical History:  Procedure Laterality Date  . CERVICAL CERCLAGE    . WISDOM TOOTH EXTRACTION      Family History  Problem Relation Age of Onset  . Hyperlipidemia Mother   . Cancer Maternal Grandmother     breast  . Cancer Paternal Grandmother     breast    Social History  Substance Use Topics  . Smoking status: Never Smoker  . Smokeless tobacco: Never Used  . Alcohol use Yes     Comment: rare    Allergies: No Known Allergies  Prescriptions Prior to Admission  Medication Sig Dispense Refill Last Dose  . bisacodyl (DULCOLAX) 5 MG EC tablet Take 5 mg by mouth daily as needed for moderate constipation.   Past Week at Unknown time  . calcium carbonate (TUMS - DOSED IN MG ELEMENTAL CALCIUM) 500 MG chewable tablet Chew 1 tablet by mouth  daily as needed for indigestion or heartburn.   Past Week at Unknown time  . docusate sodium (COLACE) 100 MG capsule Take 100 mg by mouth 2 (two) times daily.   10/14/2016 at Unknown time  . Prenatal Vit-Fe Fumarate-FA (PRENATAL MULTIVITAMIN) TABS tablet Take 1 tablet by mouth daily.    10/14/2016 at Unknown time  . progesterone (PROMETRIUM) 200 MG capsule Take 200 mg by mouth at bedtime.   10/13/2016 at Unknown time    Review of Systems  Constitutional: Negative.   Gastrointestinal: Positive for abdominal pain. Negative for constipation, diarrhea, nausea and vomiting.  Genitourinary: Negative for dysuria.       + vaginal bleeding   Physical Exam   Blood pressure 128/78, pulse 109, temperature 98.2 F (36.8 C), temperature source Oral, resp. rate 18, weight 183 lb (83 kg), last menstrual period 05/08/2016.  Physical Exam  Nursing note and vitals reviewed. Constitutional: She is oriented to person, place, and time. She appears well-developed and well-nourished. No distress.  HENT:  Head: Normocephalic and atraumatic.  Eyes: Conjunctivae are normal. Right eye exhibits no discharge. Left eye exhibits no discharge. No scleral icterus.  Neck: Normal range of motion.  Respiratory:  Effort normal. No respiratory distress.  GI: Soft. There is no tenderness.  Ctx palpate moderate w/relaxation between  Genitourinary: No bleeding in the vagina.  Genitourinary Comments: Cerclage visualized. During contraction, cervix visually dilated 1 cm w/membranes seen behind it. Digital exam deferred  Neurological: She is alert and oriented to person, place, and time.  Skin: Skin is warm and dry. She is not diaphoretic.  Psychiatric: She has a normal mood and affect. Her behavior is normal. Judgment and thought content normal.    MAU Course  Procedures Results for orders placed or performed during the hospital encounter of 10/14/16 (from the past 24 hour(s))  Urinalysis, Routine w reflex microscopic (not  at [facility])     Status: Abnormal   Collection Time: 10/14/16  5:55 PM  Result Value Ref Range   Color, Urine STRAW (A) YELLOW   APPearance CLEAR CLEAR   Specific Gravity, Urine <1.005 (L) 1.005 - 1.030   pH 6.5 5.0 - 8.0   Glucose, UA NEGATIVE NEGATIVE mg/dL   Hgb urine dipstick SMALL (A) NEGATIVE   Bilirubin Urine NEGATIVE NEGATIVE   Ketones, ur NEGATIVE NEGATIVE mg/dL   Protein, ur NEGATIVE NEGATIVE mg/dL   Nitrite NEGATIVE NEGATIVE   Leukocytes, UA SMALL (A) NEGATIVE  Urine microscopic-add on     Status: Abnormal   Collection Time: 10/14/16  5:55 PM  Result Value Ref Range   Squamous Epithelial / LPF 0-5 (A) NONE SEEN   WBC, UA 0-5 0 - 5 WBC/hpf   RBC / HPF 0-5 0 - 5 RBC/hpf   Bacteria, UA NONE SEEN NONE SEEN    MDM FHT 154/142 by doppler IV fluid bolus Dr. Marcelle OverlieHolland notified of pt's exam -- mag 4/2 & u/s to check cervical length Ultrasound -- CL 1.1 cm Assessment and Plan  A; 1. Dichorionic diamniotic twin pregnancy in second trimester   2. [redacted] weeks gestation of pregnancy   3. Cervical insufficiency in pregnancy, antepartum, second trimester    P: Admit to antenatal Dr. Marcelle OverlieHolland has discussed pt with NICU   Judeth Hornrin Hermine Feria 10/14/2016, 6:14 PM

## 2016-10-15 ENCOUNTER — Inpatient Hospital Stay (HOSPITAL_COMMUNITY): Payer: Managed Care, Other (non HMO)

## 2016-10-15 ENCOUNTER — Ambulatory Visit (HOSPITAL_COMMUNITY)
Admit: 2016-10-15 | Discharge: 2016-10-15 | Disposition: A | Discharge status: Home / Self Care | Payer: Managed Care, Other (non HMO) | Attending: Obstetrics and Gynecology | Admitting: Obstetrics and Gynecology

## 2016-10-15 DIAGNOSIS — O3432 Maternal care for cervical incompetence, second trimester: Secondary | ICD-10-CM

## 2016-10-15 MED ORDER — NIFEDIPINE 10 MG PO CAPS
10.0000 mg | ORAL_CAPSULE | Freq: Four times a day (QID) | ORAL | Status: DC
  Administered 2016-10-15: 10 mg via ORAL
  Filled 2016-10-15: qty 1

## 2016-10-15 NOTE — Progress Notes (Signed)
No new complaints.  No new bleeding since admission; scant brown d/c with wiping.  CTX have decreased in frequency.  No LOF and active FM.  VSS. AF. Toco: irritability  Gen: A&O x 3 Abd: soft, NT Ext: no c/c/e  30yo G2P0 with di/di twins and cervical incomp at 7854w6d; cerclage -BMZ#2 today (mature tomorrow at 10pm) -Continue magnesium thru BMZ maturity -Bedrest -NICU and MFM consult today  Erika HonourMegan Dylan Monforte, DO

## 2016-10-15 NOTE — Progress Notes (Signed)
Dr. Otho PerlNitsche called to inform me of ultrasound findings.  Essentially stable findings compared with yesterday's scan.  Dr. Otho PerlNitsche recommends adding nifedipine to magnesium with possibility of adding indocin.  He mentioned NICU not able to accept admissions at this time should she need delivery at this time. I called and spoke with Dr. Katrinka BlazingSmith who confirmed that NICU is closed for new admits.  I spoke with the patient about these recommendations and all questions were answered.    Erika HonourMegan Nefertari Rebman, DO

## 2016-10-15 NOTE — Discharge Summary (Signed)
OB Discharge Summary     Patient Name: Erika PopLeslie R Benningfield DOB: 03/14/1986 MRN: [id number]  Date of admission: 10/14/2016 Delivering MD: This patient has no babies on file.  Date of discharge: 10/15/2016  Admitting diagnosis: Preterm CTX Intrauterine pregnancy: 5081w6d     Secondary diagnosis: Cervical insufficiency, s/p rescue cerclage 11/11 Additional problems: Di/Di twins     Discharge diagnosis: preterm contractions; [facility]; NICU not accepting admissions                                                                                                Post partum procedures:n/a  Augmentation: n/a  Complications: None  Hospital course:  The patient was admitted with c/o spotting and increased frequency and intensity of CTX.  She was started on magnesium sulfate and given BMZ.  On magnesium, her CTX spaced out but remained intense.  Cervical length on admission and repeat in 24 hours remained unchanged at 10 mm, cerclage in place. Per MFM recommendation, the patient was placed on nifedipine as well.  NICU is unable to accept babies if delivered at this time.  [facility].    Physical exam  Vitals:   10/15/16 1153 10/15/16 1155 10/15/16 1200 10/15/16 1232  BP:      Pulse: 99 99 97   Resp:    18  Temp:      TempSrc:      SpO2: 95% 95% 96%   Weight:      Height:       General: alert Lochia: appropriate Uterine Fundus: soft Incision: N/A DVT Evaluation: No evidence of DVT seen on physical exam. Negative Homan's sign. No cords or calf tenderness. Labs: Lab Results  Component Value Date   WBC 18.9 (H) 10/14/2016   HGB 12.9 10/14/2016   HCT 36.6 10/14/2016   MCV 91.3 10/14/2016   PLT 276 10/14/2016   CMP Latest Ref Rng & Units 07/27/2014  Glucose 70 - 99 mg/dL 85  BUN 6 - 23 mg/dL 12  Creatinine 0.4 - 1.2 mg/dL 0.8  Sodium 130135 - 865145 mEq/L 137  Potassium 3.5 - 5.1 mEq/L 3.9  Chloride 96 - 112 mEq/L 105  CO2 19  - 32 mEq/L 25  Calcium 8.4 - 10.5 mg/dL 9.0  Total Protein 6.0 - 8.3 g/dL 6.9  Total Bilirubin 0.2 - 1.2 mg/dL 7.8(I1.6(H)  Alkaline Phos 39 - 117 U/L 37(L)  AST 0 - 37 U/L 20  ALT 0 - 35 U/L 19    Discharge instruction: per After Visit Summary and "Baby and Me Booklet".  After visit meds:    Medication List    TAKE these medications   bisacodyl 5 MG EC tablet Commonly known as:  DULCOLAX Take 5 mg by mouth daily as needed for moderate constipation.   calcium carbonate 500 MG chewable tablet Commonly known as:  TUMS - dosed in mg elemental calcium Chew 1 tablet by mouth daily as needed for indigestion or heartburn.   docusate sodium 100 MG capsule Commonly known as:  COLACE Take 100 mg by mouth  2 (two) times daily.   prenatal multivitamin Tabs tablet Take 1 tablet by mouth daily.   progesterone 200 MG capsule Commonly known as:  PROMETRIUM Take 200 mg by mouth at bedtime.       Diet: routine diet  Activity: Advance as tolerated. Pelvic rest for 6 weeks.   Outpatient follow up:n/a Follow up Appt:Future Appointments Date Time Provider Department Center  10/18/2016 3:45 PM [facility] US 2 [facility]   Follow up Visit:No Follow-up on file.  Postpartum contraception: n/a  Newborn Data: This patient has no babies on file. Baby Feeding: n/a Disposition:n/a   10/15/2016 Myrel Rappleye, DO

## 2016-10-15 NOTE — Progress Notes (Signed)
MATERNAL FETAL MEDICINE CONSULT  Patient Name: Erika Bryan Medical Record Number:  [id number] Date of Birth: 03/27/1986 Requesting Physician Name:  Richarda Overlieichard Holland, MD Date of Service: 10/15/2016  Chief Complaint Cervical insufficiency  History of Present Illness Erika PopLeslie R Bryan was seen today secondary to cervical insufficiency at the request of Richarda Overlieichard Holland, MD.  The patient is a 30 y.o. G2P0010,at 3350w6d with Di/di twins with an EDD of 02/12/2017, by Last Menstrual Period dating method.  Ms. Erika Bryan had a rescue cerclage 2 weeks ago due cervical insufficiency.  She was seen for a follow up transvaginal cervical length measurement approximately 1 weeks ago and it measured 1 cm.  Two or three days ago she noticed some bright red vaginal spotting and developed abdominal cramping/tightening.  [facility].  She has no had any more vaginal bleeding, but continue to report intermittent contractions.  She does no have any leakage of fluid.  Both fetuses are active.  Review of Systems Pertinent items are noted in HPI.  Patient History OB History  Gravida Para Term Preterm AB Living  2       1    SAB TAB Ectopic Multiple Live Births  1            # Outcome Date GA Lbr Len/2nd Weight Sex Delivery Anes PTL Lv  2 Current           1 SAB               Past Medical History:  Diagnosis Date  . Chicken pox   . HPV (human papilloma virus) infection   . IBS (irritable bowel syndrome)    s/p evaluation including colonoscopy which was normal    Past Surgical History:  Procedure Laterality Date  . CERVICAL CERCLAGE    . WISDOM TOOTH EXTRACTION      Social History   Social History  . Marital status: Single    Spouse name: N/A  . Number of children: N/A  . Years of education: N/A   Social History Main Topics  . Smoking status: Never Smoker  . Smokeless tobacco: Never Used  . Alcohol use Yes     Comment: rare  . Drug use: No  .  Sexual activity: Not Currently    Birth control/ protection: None   Other Topics Concern  . Not on file   Social History Narrative   [place]. No pets.      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 Hyperlipidemia Mother   . Cancer Maternal Grandmother     breast  . Cancer Paternal Grandmother     breast   In addition, the patient has no family history of mental retardation, birth defects, or genetic diseases.  Physical Examination There were no vitals filed for this visit. General appearance - alert, well appearing, and in no distress Mental status - alert, oriented to person, place, and time Abdomen - soft, nontender, nondistended, no masses or organomegaly Extremities - no pedal edema noted  Assessment and Recommendations 1.  Cervical insufficiency.  Ms. Erika Bryan had a transvaginal cervical length measurement of 1.0 cm today.  This is unchanged from her first ultrasound following the cerclage placement.  However, she continues to report contractions which raises concern for preterm labor.  Ms. Erika Bryan would like full resuscitation of her twins after birth.  In that regard she has already had her first dose of betamethasone and will have her second dose later today.  She is currently on magnesium sulfate which was started at approximately 7 pm last evening.  As magnesium is not an effective tocolytic I recommend starting either nifedipine or indomethacin for this indication, until 24 hour after her second dose of betamethasone.  The need for magnesium neuroprotection should be reassessed every 12 hours.  If the risk of delivery is sufficiently low it can be discontinued.  I spent 30 minutes with Ms. Erika Bryan today of which 50% was face-to-face counseling.  Thank you for referring [facility]. Erika Bryan [facility].  Please do not hesitate to contact us with  questions.   Erika Bryan,Opel Lejeune, MD

## 2016-10-16 LAB — CULTURE, BETA STREP (GROUP B ONLY)

## 2016-10-18 ENCOUNTER — Ambulatory Visit (HOSPITAL_COMMUNITY): Payer: Managed Care, Other (non HMO)

## 2016-10-25 ENCOUNTER — Encounter (HOSPITAL_COMMUNITY): Payer: Self-pay

## 2016-10-25 ENCOUNTER — Ambulatory Visit (HOSPITAL_COMMUNITY): Payer: Managed Care, Other (non HMO)

## 2016-10-25 NOTE — Discharge Summary (Signed)
Physician Discharge Summary  Patient ID: Erika Bryan MRN: [id number] DOB/AGE: 30/12/1985 30Gold[age] y.o.  Admit date: 09/27/2016 Discharge date: 10/25/2016  Admission Diagnoses: twins  Discharge Diagnoses:  Active Problems:   Short cervix during pregnancy in second trimester   Discharged Condition: stable  Hospital Course: Pt wad admitted with painless shortening cervix.  No vb or lof.  During her admission, pessary was placed but cervix continued to shorten and dilate.  [facility] and further mngt  Consults: MFM  Significant Diagnostic Studies: ultrasound  Treatments: pessary, bedrest  Discharge Exam: Blood pressure 119/83, pulse 91, temperature 98.4 F (36.9 C), temperature source Oral, resp. rate 18, height 5\' 3"  (1.6 m), weight 180 lb (81.6 kg), last menstrual period 05/08/2016, SpO2 97 %. Gen - NAD Abd - gravid, NT Ext - NT , no edmea  [facility]     Medication List    ASK your doctor about these medications   prenatal multivitamin Tabs tablet Take 1 tablet by mouth daily.        Signed: Pavlik,Mayrene Bastarache 10/25/2016, 7:59 AM

## 2017-08-02 ENCOUNTER — Encounter (HOSPITAL_COMMUNITY): Payer: Self-pay

## 2018-06-28 IMAGING — US US MFM OB TRANSVAGINAL
1 series · 15 of 28 positions shown · non-contrast
Comparison: none

[Series 1: us mfm ob transvaginal · 28 acquisitions, 15 frames shown]
[im 1/28]
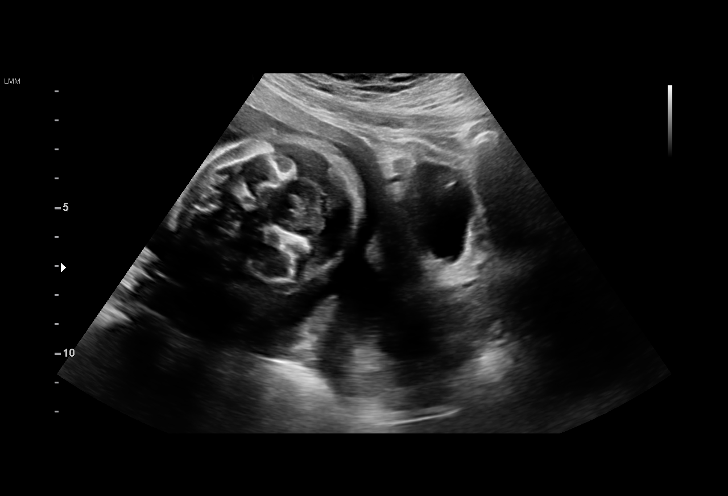
[im 3/28]
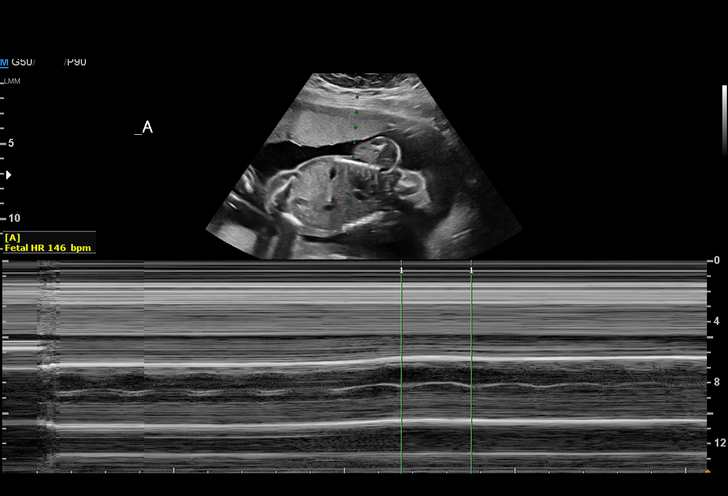
[im 5/28]
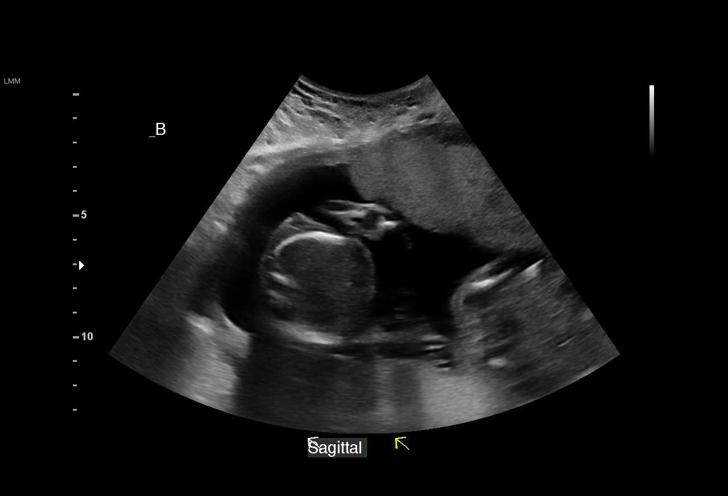
[im 7/28]
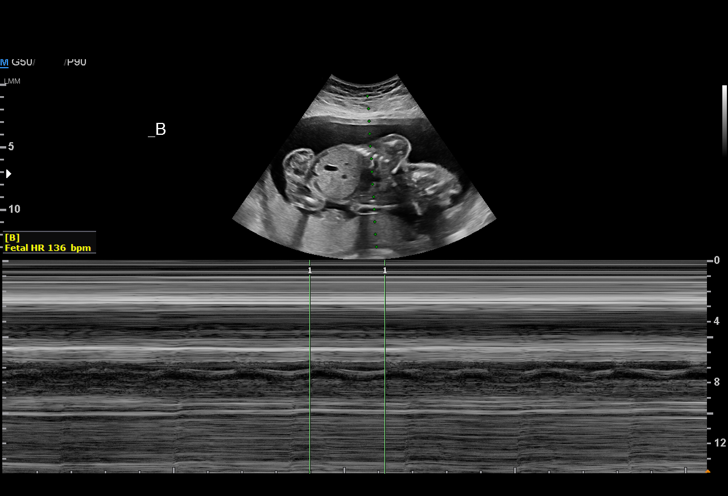
[im 9/28]
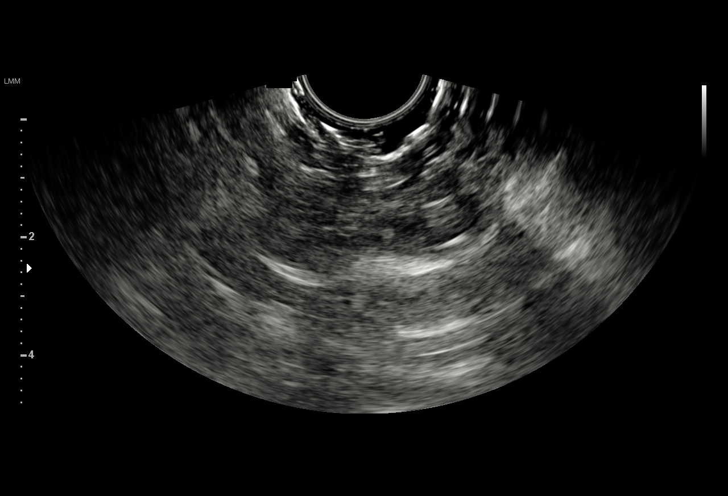
[im 11/28]
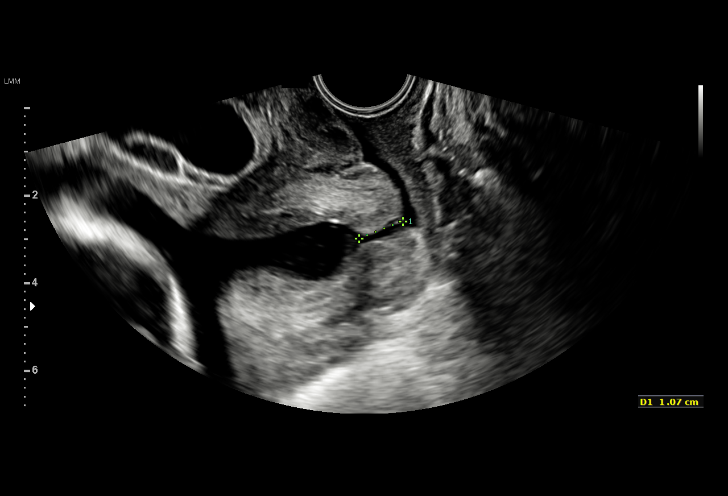
[im 13/28]
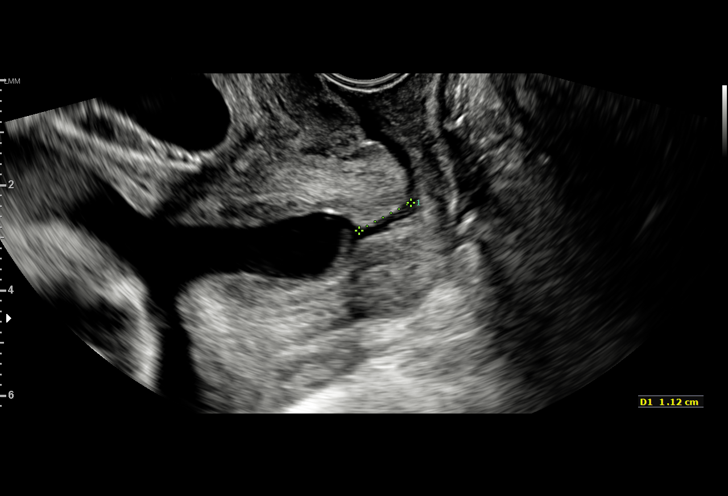
[im 15/28]
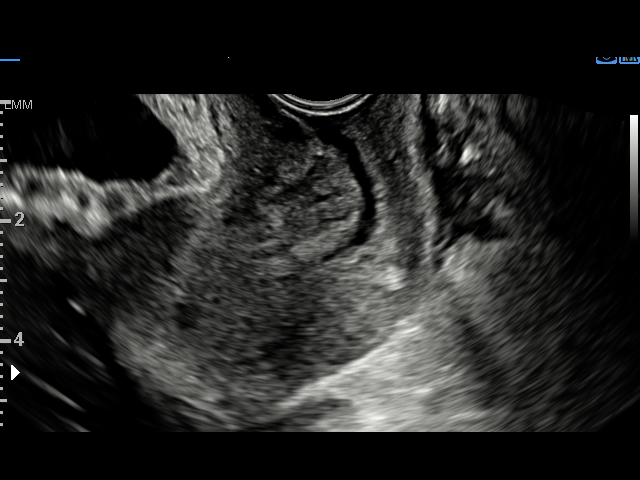
[im 16/28]
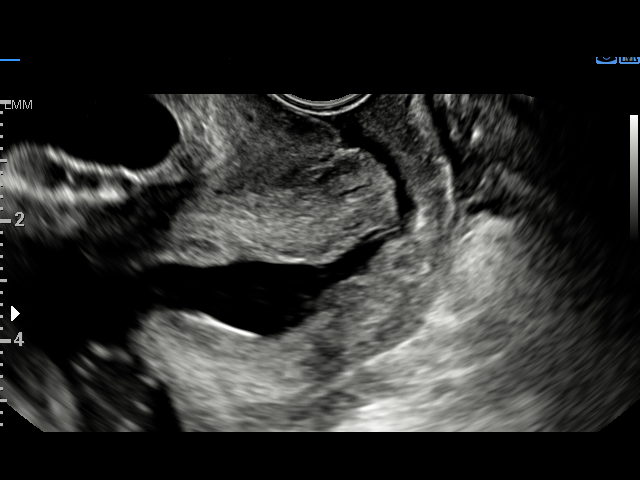
[im 18/28]
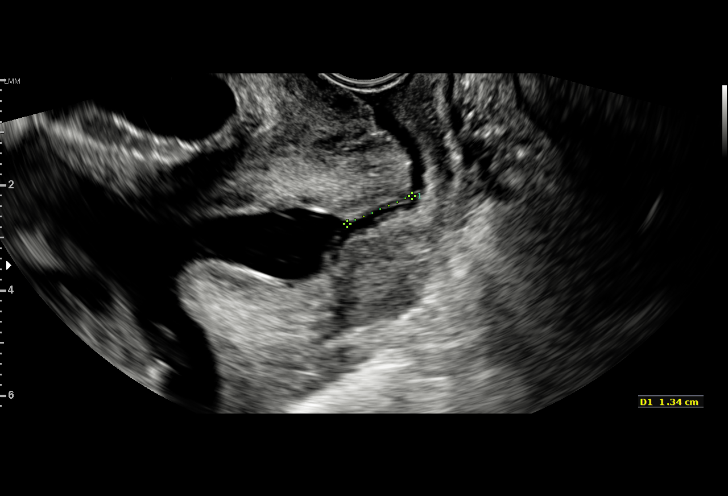
[im 20/28]
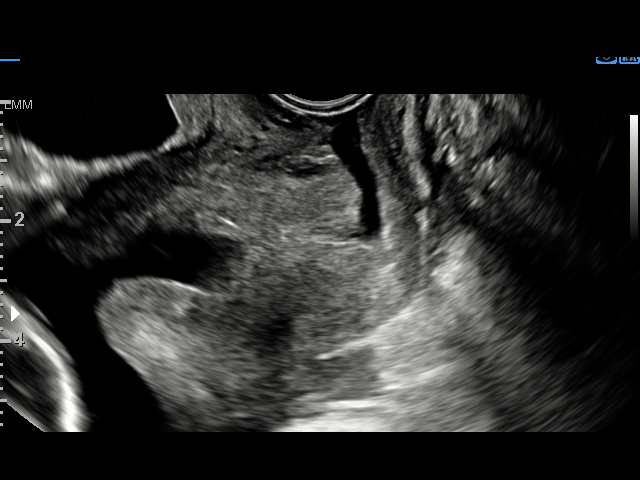
[im 22/28]
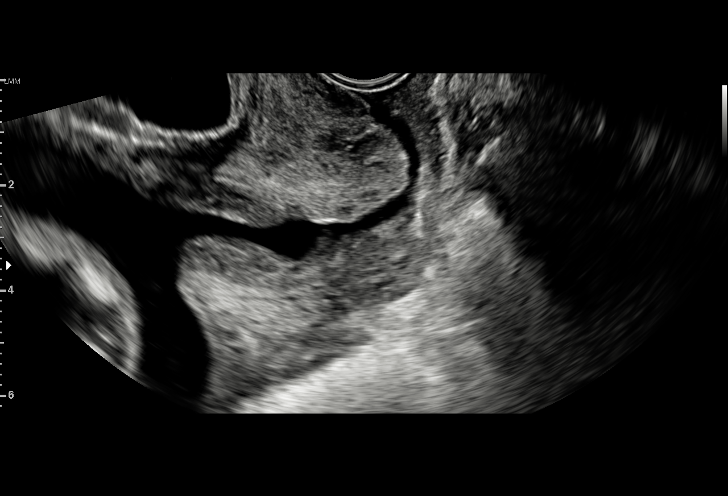
[im 24/28]
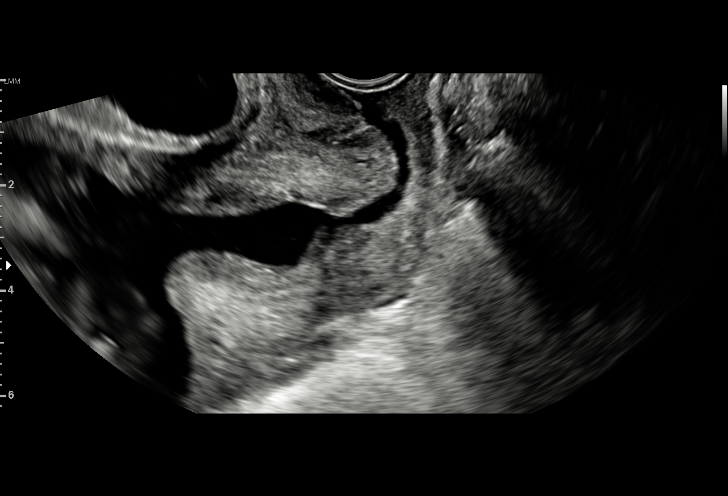
[im 26/28]
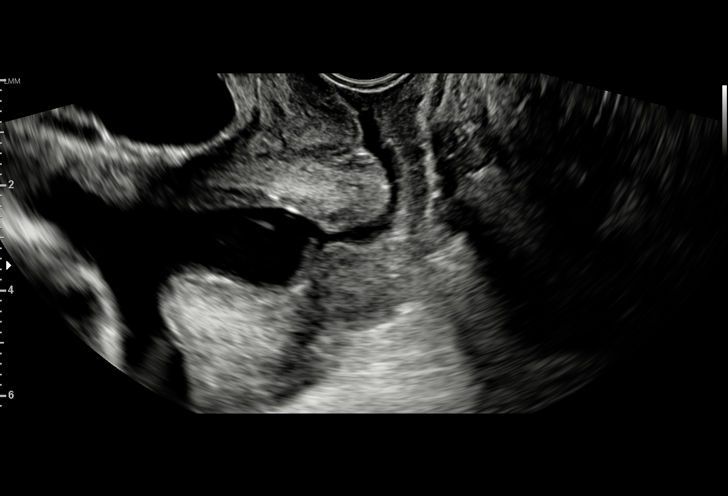
[im 28/28]
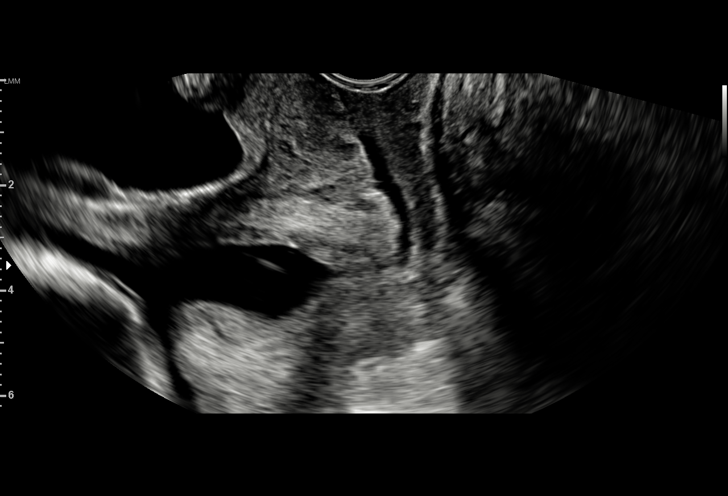

[15 of 28 positions shown; findings below may reference images not displayed]

[address]
[place]; [HOSPITAL]
Attending:        Julio Manuel Kawai       Secondary Phy.:    ANTE Nursing-
Antenatal [REDACTED]9

1  MARCOLFA MARCK             [id number]      [id number]     [id number]
Indications

20 weeks gestation of pregnancy
Twin pregnancy, di/di, second trimester
Cervical shortening, second trimester
OB History

Gravidity:    2          SAB:   1
Living:       0
Fetal Evaluation (Fetus A)

Num Of Fetuses:     2
Fetal Heart         146
Rate(bpm):
Cardiac Activity:   Observed
Fetal Lie:          Lower Fetus
Presentation:       Cephalic
Membrane Desc:      Dividing Membrane seen

Amniotic Fluid
AFI FV:      Subjectively within normal limits
Gestational Age (Fetus A)

LMP:           20w 4d        Date:  05/08/16                 EDD:   02/12/17
Best:          20w 4d     Det. By:  LMP  (05/08/16)          EDD:   02/12/17
Fetal Evaluation (Fetus B)
Num Of Fetuses:     2
Fetal Heart         136
Rate(bpm):
Cardiac Activity:   Observed
Fetal Lie:          Upper Fetus
Presentation:       Transverse, head to maternal left
Membrane Desc:      Dividing Membrane seen

Amniotic Fluid
AFI FV:      Subjectively within normal limits
Gestational Age (Fetus B)

LMP:           20w 4d        Date:  05/08/16                 EDD:   02/12/17
Best:          20w 4d     Det. By:  LMP  (05/08/16)          EDD:   02/12/17
Cervix Uterus Adnexa

Cervix
Length:            1.1  cm.
Dynamic changes observed. Length 1.1 - 4.0 cm. Fluid in
endocervical canal and proximal vagina.
Impression

Di-di twin gestation at 97w9d (remote read)
active twin fetuses
cervix is funneled measuring only 1.1cm in functional length
(dynamic changes seen in exam)
Recommendations

Recommend correlation with clinical exam and uterine activity.
Would reassess TVUS in 1-2 weeks depending on patient
symptoms and clinical exam.
would also recommend Moben 200mg pv qhs until 36
weeks
